# Patient Record
Sex: Male | Born: 1990 | Race: White | Hispanic: No | Marital: Married | Smoking: Current every day smoker
Health system: Southern US, Community
[De-identification: ages and names within clinical notes are randomized; demographics above are authoritative.]

## PROBLEM LIST (undated history)

## (undated) DIAGNOSIS — R454 Irritability and anger: Secondary | ICD-10-CM

## (undated) DIAGNOSIS — F419 Anxiety disorder, unspecified: Secondary | ICD-10-CM

## (undated) DIAGNOSIS — F6381 Intermittent explosive disorder: Secondary | ICD-10-CM

## (undated) DIAGNOSIS — K219 Gastro-esophageal reflux disease without esophagitis: Secondary | ICD-10-CM

---
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
## 2011-03-18 ENCOUNTER — Ambulatory Visit: Payer: Self-pay | Admitting: Unknown Physician Specialty

## 2011-03-31 ENCOUNTER — Ambulatory Visit: Payer: Self-pay | Admitting: Unknown Physician Specialty

## 2011-04-24 ENCOUNTER — Ambulatory Visit: Payer: Self-pay | Admitting: Unknown Physician Specialty

## 2011-05-01 ENCOUNTER — Ambulatory Visit: Payer: Self-pay | Admitting: Unknown Physician Specialty

## 2011-05-31 ENCOUNTER — Ambulatory Visit: Payer: Self-pay | Admitting: Unknown Physician Specialty

## 2011-07-01 ENCOUNTER — Ambulatory Visit: Payer: Self-pay | Admitting: Unknown Physician Specialty

## 2011-07-17 ENCOUNTER — Ambulatory Visit: Payer: Self-pay | Admitting: Unknown Physician Specialty

## 2011-07-29 LAB — RAPID URINE DRUG SCREEN, HOSP PERFORMED
Amphetamines, Ur Screen: NEGATIVE (ref ?–1000)
Barbiturates, Ur Screen: NEGATIVE (ref ?–200)
Benzodiazepine, Ur Scrn: NEGATIVE (ref ?–200)
Cannabinoid 50 Ng, Ur ~~LOC~~: POSITIVE (ref ?–50)
Opiate, Ur Screen: NEGATIVE (ref ?–300)

## 2012-06-18 ENCOUNTER — Emergency Department: Payer: Self-pay | Admitting: Emergency Medicine

## 2012-06-18 LAB — URINALYSIS, COMPLETE
Bacteria: NONE SEEN
Bilirubin,UR: NEGATIVE
Blood: NEGATIVE
Glucose,UR: NEGATIVE mg/dL (ref 0–75)
Leukocyte Esterase: NEGATIVE
Ph: 5 (ref 4.5–8.0)
Protein: 30
RBC,UR: 1 /HPF (ref 0–5)
Squamous Epithelial: NONE SEEN

## 2012-06-18 LAB — LIPASE, BLOOD: Lipase: 100 U/L (ref 73–393)

## 2012-06-18 LAB — COMPREHENSIVE METABOLIC PANEL
Alkaline Phosphatase: 84 U/L (ref 50–136)
Anion Gap: 9 (ref 7–16)
Bilirubin,Total: 0.4 mg/dL (ref 0.2–1.0)
Calcium, Total: 9.2 mg/dL (ref 8.5–10.1)
Chloride: 106 mmol/L (ref 98–107)
Co2: 25 mmol/L (ref 21–32)
Creatinine: 0.96 mg/dL (ref 0.60–1.30)
EGFR (African American): >60
EGFR (Non-African Amer.): >60
Potassium: 3.5 mmol/L (ref 3.5–5.1)
SGOT(AST): 26 U/L (ref 15–37)
SGPT (ALT): 22 U/L (ref 12–78)

## 2012-06-18 LAB — CBC
MCH: 32.7 pg (ref 26.0–34.0)
MCV: 96 fL (ref 80–100)
Platelet: 178 10*3/uL (ref 150–440)
RBC: 4.81 10*6/uL (ref 4.40–5.90)

## 2018-01-13 ENCOUNTER — Other Ambulatory Visit: Payer: Self-pay

## 2018-01-13 ENCOUNTER — Encounter: Payer: Self-pay | Admitting: Emergency Medicine

## 2018-01-13 ENCOUNTER — Emergency Department
Admission: EM | Admit: 2018-01-13 | Discharge: 2018-01-13 | Disposition: A | Discharge status: Home / Self Care | Payer: PRIVATE HEALTH INSURANCE | Attending: Emergency Medicine | Admitting: Emergency Medicine

## 2018-01-13 DIAGNOSIS — F6381 Intermittent explosive disorder: Secondary | ICD-10-CM

## 2018-01-13 DIAGNOSIS — F331 Major depressive disorder, recurrent, moderate: Secondary | ICD-10-CM

## 2018-01-13 DIAGNOSIS — F321 Major depressive disorder, single episode, moderate: Secondary | ICD-10-CM | POA: Diagnosis not present

## 2018-01-13 HISTORY — DX: Irritability and anger: R45.4

## 2018-01-13 HISTORY — DX: Gastro-esophageal reflux disease without esophagitis: K21.9

## 2018-01-13 HISTORY — DX: Intermittent explosive disorder: F63.81

## 2018-01-13 HISTORY — DX: Anxiety disorder, unspecified: F41.9

## 2018-01-13 LAB — URINALYSIS, COMPLETE (UACMP) WITH MICROSCOPIC
BACTERIA UA: NONE SEEN
Bilirubin Urine: NEGATIVE
Glucose, UA: NEGATIVE mg/dL
Hgb urine dipstick: NEGATIVE
KETONES UR: 20 mg/dL — AB
Leukocytes, UA: NEGATIVE
Nitrite: NEGATIVE
PH: 5 (ref 5.0–8.0)
Protein, ur: NEGATIVE mg/dL
SPECIFIC GRAVITY, URINE: 1.024 (ref 1.005–1.030)
SQUAMOUS EPITHELIAL / LPF: NONE SEEN (ref 0–5)

## 2018-01-13 LAB — COMPREHENSIVE METABOLIC PANEL
ALBUMIN: 4.9 g/dL (ref 3.5–5.0)
ALK PHOS: 47 U/L (ref 38–126)
ALT: 21 U/L (ref 0–44)
ANION GAP: 8 (ref 5–15)
AST: 30 U/L (ref 15–41)
BILIRUBIN TOTAL: 1.5 mg/dL — AB (ref 0.3–1.2)
BUN: 15 mg/dL (ref 6–20)
CALCIUM: 9.5 mg/dL (ref 8.9–10.3)
CO2: 25 mmol/L (ref 22–32)
CREATININE: 0.79 mg/dL (ref 0.61–1.24)
Chloride: 107 mmol/L (ref 98–111)
GFR calc Af Amer: >60 mL/min (ref >60)
GFR calc non Af Amer: >60 mL/min (ref >60)
GLUCOSE: 99 mg/dL (ref 70–99)
Potassium: 3.7 mmol/L (ref 3.5–5.1)
Sodium: 140 mmol/L (ref 135–145)
TOTAL PROTEIN: 7.6 g/dL (ref 6.5–8.1)

## 2018-01-13 LAB — URINE DRUG SCREEN, QUALITATIVE (ARMC ONLY)
Amphetamines, Ur Screen: NOT DETECTED
BENZODIAZEPINE, UR SCRN: NOT DETECTED
Cannabinoid 50 Ng, Ur ~~LOC~~: POSITIVE — AB
Cocaine Metabolite,Ur ~~LOC~~: NOT DETECTED
MDMA (Ecstasy)Ur Screen: NOT DETECTED
Methadone Scn, Ur: NOT DETECTED
OPIATE, UR SCREEN: NOT DETECTED
PHENCYCLIDINE (PCP) UR S: NOT DETECTED
Tricyclic, Ur Screen: NOT DETECTED

## 2018-01-13 LAB — CBC
HEMATOCRIT: 42.4 % (ref 40.0–52.0)
HEMOGLOBIN: 14.6 g/dL (ref 13.0–18.0)
MCH: 33.2 pg (ref 26.0–34.0)
MCHC: 34.5 g/dL (ref 32.0–36.0)
MCV: 96.3 fL (ref 80.0–100.0)
Platelets: 172 10*3/uL (ref 150–440)
RBC: 4.41 MIL/uL (ref 4.40–5.90)
RDW: 13.6 % (ref 11.5–14.5)
WBC: 7.6 10*3/uL (ref 3.8–10.6)

## 2018-01-13 LAB — ETHANOL: Alcohol, Ethyl (B): <10 mg/dL (ref <10)

## 2018-01-13 LAB — ACETAMINOPHEN LEVEL

## 2018-01-13 LAB — SALICYLATE LEVEL

## 2018-01-13 MED ORDER — SERTRALINE HCL 100 MG PO TABS: 100.0000 mg | ORAL_TABLET | Freq: Every day | ORAL | 0 refills | Status: DC

## 2018-01-13 MED ORDER — NICOTINE 14 MG/24HR TD PT24
14.0000 mg | MEDICATED_PATCH | Freq: Once | TRANSDERMAL | Status: DC
  Administered 2018-01-13: 14 mg via TRANSDERMAL
  Filled 2018-01-13: qty 1

## 2018-01-13 NOTE — ED Notes (Signed)
BEHAVIORAL HEALTH ROUNDING Patient sleeping: No. Patient alert and oriented: yes Behavior appropriate: Yes.  ; If no, describe:  Nutrition and fluids offered: yes Toileting and hygiene offered: Yes  Sitter present: q15 minute observations and security monitoring Law enforcement present: Yes    

## 2018-01-13 NOTE — ED Notes (Signed)
Hourly rounding reveals patient sleeping in room. No complaints, stable, in no acute distress. Q15 minute rounds and monitoring via Security to continue. 

## 2018-01-13 NOTE — Discharge Instructions (Addendum)
Please seek medical attention and help for any thoughts about wanting to harm yourself, harm others, any concerning change in behavior, severe depression, inappropriate drug use or any other new or concerning symptoms. ° °

## 2018-01-13 NOTE — ED Notes (Signed)
Labs drawn and sent to lab.  Pt dresed out in [place] scrubs by SYSCOCole RN.  Pt belongings include, boots, socks, jeans, underwear, black tank top, shirt, cigs, lighter, brown wallet, 4.00, cell phone with cracked screen, keys, 2 silver colored rings, 1 silver colored rope chain with silver colored charm.  Pt has 2 facial dermal piercings that can not be removed.  Belongings labelled and placed at Nurses station in [place].

## 2018-01-13 NOTE — ED Provider Notes (Signed)
[facility] Emergency Department Provider Note   ____________________________________________   First MD Initiated Contact with Patient 01/13/18 [phone]     (approximate)  I have reviewed the triage vital signs and the nursing notes.   HISTORY  Chief Complaint Psychiatric Evaluation    HPI Damon Romero is a 27 y.o. male who presents to the ED from home with a chief complaint of depression.  Patient has been seeing his PCP for depression and took Effexor in the spring.  Patient self discontinued this secondary to side effects.  Previously he also had side effects from Prozac.  He was recently started on Zoloft and feels like it is not working.  Denies active SI/HI/AH/VH.  Voices no medical complaints.  Past medical history None  There are no active problems to display for this patient.    Prior to Admission medications   Not on File    Allergies Patient has no known allergies.  No family history on file.  Social History Social History   Tobacco Use  . Smoking status: Not on file  Substance Use Topics  . Alcohol use: Not on file  . Drug use: Not on file  Denies recent drug use   Review of Systems  Constitutional: No fever/chills Eyes: No visual changes. ENT: No sore throat. Cardiovascular: Denies chest pain. Respiratory: Denies shortness of breath. Gastrointestinal: No abdominal pain.  No nausea, no vomiting.  No diarrhea.  No constipation. Genitourinary: Negative for dysuria. Musculoskeletal: Negative for back pain. Skin: Negative for rash. Neurological: Negative for headaches, focal weakness or numbness. Psychiatric:Positive for depression.   ____________________________________________   PHYSICAL EXAM:  VITAL SIGNS: ED Triage Vitals  Enc Vitals Group     BP 01/13/18 0558 140/83     Pulse Rate 01/13/18 0558 86     Resp 01/13/18 0558 20     Temp 01/13/18 0558 98.2 F (36.8 C)     Temp Source 01/13/18 0558 Oral     SpO2 01/13/18 0558 99 %     Weight 01/13/18 0559 180 lb (81.6 kg)     Height 01/13/18 0559 6\' 3"  (1.905 m)     Head Circumference --      Peak Flow --      Pain Score 01/13/18 0559 0     Pain Loc --      Pain Edu? --      Excl. in GC? --     Constitutional: Alert and oriented. Well appearing and in no acute distress. Eyes: Conjunctivae are normal. PERRL. EOMI. Head: Atraumatic. Nose: No congestion/rhinnorhea. Mouth/Throat: Mucous membranes are moist.  Oropharynx non-erythematous. Neck: No stridor.   Cardiovascular: Normal rate, regular rhythm. Grossly normal heart sounds.  Good peripheral circulation. Respiratory: Normal respiratory effort.  No retractions. Lungs CTAB. Gastrointestinal: Soft and nontender. No distention. No abdominal bruits. No CVA tenderness. Musculoskeletal: No lower extremity tenderness nor edema.  No joint effusions. Neurologic:  Normal speech and language. No gross focal neurologic deficits are appreciated. No gait instability. Skin:  Skin is warm, dry and intact. No rash noted. Psychiatric: Mood and affect are normal. Speech and behavior are normal.  ____________________________________________   LABS (all labs ordered are listed, but only abnormal results are displayed)  Labs Reviewed  CBC  COMPREHENSIVE METABOLIC PANEL  ETHANOL  URINE DRUG SCREEN, QUALITATIVE ([facility] ONLY)  URINALYSIS, COMPLETE ([facility]) WITH MICROSCOPIC   ____________________________________________  EKG  None ____________________________________________  RADIOLOGY  ED MD interpretation: None  Official radiology report(s): No results  found.  ____________________________________________   PROCEDURES  Procedure(s) performed: None  Procedures  Critical Care performed: No  ____________________________________________   INITIAL IMPRESSION / ASSESSMENT AND PLAN / ED COURSE  As part of my medical decision making, I reviewed the following data within the electronic  MEDICAL RECORD NUMBER Nursing notes reviewed and incorporated, Labs reviewed, A consult was requested and obtained from this/these consultant(s) Psychiatry and Notes from prior ED visits   27 year old male who presents with depression.  Contracts for safety while in the emergency department.  Will remain in the ED voluntarily pending TTS and psychiatry evaluations.  [facility] psychiatric disposition.      ____________________________________________   FINAL CLINICAL IMPRESSION(S) / ED DIAGNOSES  Final diagnoses:  Moderate episode of recurrent major depressive disorder [facility](HCC)     ED Discharge Orders    None       Note:  This document was prepared using Dragon voice recognition software and may include unintentional dictation errors.    Irean HongSung, Kamariyah Timberlake J, MD 01/13/18 [phone]

## 2018-01-13 NOTE — Consult Note (Signed)
[name] Psychiatry Consult   Reason for Consult: Consult for 27 year old man who came voluntarily to the emergency room requesting help with depression Referring Physician: Archie Balboa Patient Identification: Damon Romero MRN:  [id number] Principal Diagnosis: Moderate major depression, single episode [facility]) Diagnosis:   Patient Active Problem List   Diagnosis Date Noted  . Moderate major depression, single episode ([place]) [F32.1] 01/13/2018  . Intermittent explosive disorder [F63.81] 01/13/2018    Total Time spent with patient: 1.5 hours  Subjective:   Damon Romero is a 27 y.o. male patient admitted with "I get very angry".  HPI: Patient interviewed chart reviewed.  Patient reports that he has a long-standing problem with irritability and anger.  Minor things get him irritated and he will verbally lash out at people around him.  He will immediately feel guilty about it but stays irritable.  Last night was somewhat different.  He was feeling very sad and depressed last night.  He went to sleep much earlier than he usually does last night.  Another difference is that he had some alcohol last night which he does not usually do.  Patient denies having any wish to kill himself.  Denies any wish or intent to do any physical harm to anyone else.  Denies any psychotic symptoms.  He has seen his primary care doctor and been prescribed Zoloft 50 mg a day which he has been taking for about 3 weeks.  Patient says he abuses cannabis a little bit every now and then denies other drug use.  Social history: Patient lives with his long-term girlfriend and they have 3 children at home.  He works full-time.  He feels guilty about how his anger influences his wife and children.  Medical history: History of gastric reflux otherwise no significant medical problems  Substance abuse history: Says he generally does not drink last night was a bit of an exception.  Uses "a little" marijuana.  Past  Psychiatric History: Patient has never been in a psychiatric hospital no history of suicide attempts no history of hallucinations or major violence.  Risk to Self: Suicidal Ideation: No Suicidal Intent: No Is patient at risk for suicide?: No Suicidal Plan?: No Access to Means: No What has been your use of drugs/alcohol within the last 12 months?: THC  How many times?: 0 Other Self Harm Risks: None reported Triggers for Past Attempts: Other (Comment)(None reported) Intentional Self Injurious Behavior: None Risk to Others: Homicidal Ideation: No Thoughts of Harm to Others: No Current Homicidal Intent: No Current Homicidal Plan: No Access to Homicidal Means: No Identified Victim: None reported  History of harm to others?: No Assessment of Violence: In past 6-12 months Violent Behavior Description: patient stated is easily angered Does patient have access to weapons?: No Criminal Charges Pending?: No Does patient have a court date: No Prior Inpatient Therapy: Prior Inpatient Therapy: No Prior Outpatient Therapy: Prior Outpatient Therapy: No Does patient have an ACCT team?: No Does patient have Intensive In-House Services?  : No Does patient have [facility] services? : No Does patient have P4CC services?: No  Past Medical History:  Past Medical History:  Diagnosis Date  . Anxiety   . GERD (gastroesophageal reflux disease)   . Intermittent explosive disorder   . Irritability and anger     Family History: No family history on file. Family Psychiatric  History: Patient reports that his father had similar symptoms with angry and depressed all the time Social History:  Social History   Substance and  Sexual Activity  Alcohol Use Not Currently     Social History   Substance and Sexual Activity  Drug Use Yes  . Types: Marijuana    Social History   Socioeconomic History  . Marital status: Single    Spouse name: Not on file  . Number of children: Not on file  . Years of  education: Not on file  . Highest education level: Not on file  Occupational History  . Not on file  Social Needs  . Financial resource strain: Not on file  . Food insecurity:    Worry: Not on file    Inability: Not on file  . Transportation needs:    Medical: Not on file    Non-medical: Not on file  Tobacco Use  . Smoking status: Current Every Day Smoker  . Smokeless tobacco: Never Used  Substance and Sexual Activity  . Alcohol use: Not Currently  . Drug use: Yes    Types: Marijuana  . Sexual activity: Yes  Lifestyle  . Physical activity:    Days per week: Not on file    Minutes per session: Not on file  . Stress: Not on file  Relationships  . Social connections:    Talks on phone: Not on file    Gets together: Not on file    Attends religious service: Not on file    Active member of club or organization: Not on file    Attends meetings of clubs or organizations: Not on file    Relationship status: Not on file  Other Topics Concern  . Not on file  Social History Narrative  . Not on file   Additional Social History:    Allergies:  No Known Allergies  Labs:  Results for orders placed or performed during the hospital encounter of 01/13/18 (from the past 48 hour(s))  CBC     Status: None   Collection Time: 01/13/18  6:05 AM  Result Value Ref Range   WBC 7.6 3.8 - 10.6 K/uL   RBC 4.41 4.40 - 5.90 MIL/uL   Hemoglobin 14.6 13.0 - 18.0 g/dL   HCT 42.4 40.0 - 52.0 %   MCV 96.3 80.0 - 100.0 fL   MCH 33.2 26.0 - 34.0 pg   MCHC 34.5 32.0 - 36.0 g/dL   RDW 13.6 11.5 - 14.5 %   Platelets 172 150 - 440 K/uL    Comment: Performed at [facility], [address] [place], [place] [address]  Comprehensive metabolic panel     Status: Abnormal   Collection Time: 01/13/18  6:05 AM  Result Value Ref Range   Sodium 140 135 - 145 mmol/L   Potassium 3.7 3.5 - 5.1 mmol/L   Chloride 107 98 - 111 mmol/L    Comment: Please note change in reference range.   CO2 25 22 -  32 mmol/L   Glucose, Bld 99 70 - 99 mg/dL    Comment: Please note change in reference range.   BUN 15 6 - 20 mg/dL    Comment: Please note change in reference range.   Creatinine, Ser 0.79 0.61 - 1.24 mg/dL   Calcium 9.5 8.9 - 10.3 mg/dL   Total Protein 7.6 6.5 - 8.1 g/dL   Albumin 4.9 3.5 - 5.0 g/dL   AST 30 15 - 41 U/L   ALT 21 0 - 44 U/L    Comment: Please note change in reference range.   Alkaline Phosphatase 47 38 - 126 U/L  Total Bilirubin 1.5 (H) 0.3 - 1.2 mg/dL   GFR calc non Af Amer >60 >60 mL/min   GFR calc Af Amer >60 >60 mL/min    Comment: (NOTE) The eGFR has been calculated using the CKD EPI equation. This calculation has not been validated in all clinical situations. eGFR's persistently <60 mL/min signify possible Chronic Kidney Disease.    Anion gap 8 5 - 15    Comment: Performed at [facility], [address] [place], [place] [address]  Ethanol     Status: None   Collection Time: 01/13/18  6:05 AM  Result Value Ref Range   Alcohol, Ethyl (B) <10 <10 mg/dL    Comment: (NOTE) Lowest detectable limit for serum alcohol is 10 mg/dL. For medical purposes only. Performed at [facility], [address] [place], [place] [address]   Urine Drug Screen, Qualitative [facility] only)     Status: Abnormal   Collection Time: 01/13/18  6:05 AM  Result Value Ref Range   Tricyclic, Ur Screen NONE DETECTED NONE DETECTED   Amphetamines, Ur Screen NONE DETECTED NONE DETECTED   MDMA (Ecstasy)Ur Screen NONE DETECTED NONE DETECTED   Cocaine Metabolite,Ur [place] NONE DETECTED NONE DETECTED   Opiate, Ur Screen NONE DETECTED NONE DETECTED   Phencyclidine (PCP) Ur S NONE DETECTED NONE DETECTED   Cannabinoid 50 Ng, Ur [place] POSITIVE (A) NONE DETECTED   Barbiturates, Ur Screen (A) NONE DETECTED    Result not available. Reagent lot number recalled by manufacturer.   Benzodiazepine, Ur Scrn NONE DETECTED NONE DETECTED   Methadone Scn, Ur NONE DETECTED NONE DETECTED     Comment: (NOTE) Tricyclics + metabolites, urine    Cutoff 1000 ng/mL Amphetamines + metabolites, urine  Cutoff 1000 ng/mL MDMA (Ecstasy), urine              Cutoff 500 ng/mL Cocaine Metabolite, urine          Cutoff 300 ng/mL Opiate + metabolites, urine        Cutoff 300 ng/mL Phencyclidine (PCP), urine         Cutoff 25 ng/mL Cannabinoid, urine                 Cutoff 50 ng/mL Barbiturates + metabolites, urine  Cutoff 200 ng/mL Benzodiazepine, urine              Cutoff 200 ng/mL Methadone, urine                   Cutoff 300 ng/mL The urine drug screen provides only a preliminary, unconfirmed analytical test result and should not be used for non-medical purposes. Clinical consideration and professional judgment should be applied to any positive drug screen result due to possible interfering substances. A more specific alternate chemical method must be used in order to obtain a confirmed analytical result. Gas chromatography / mass spectrometry (GC/MS) is the preferred confirmat ory method. Performed at [facility], [address] [place],  [address]   Urinalysis, Complete w Microscopic     Status: Abnormal   Collection Time: 01/13/18  6:05 AM  Result Value Ref Range   Color, Urine AMBER (A) YELLOW    Comment: BIOCHEMICALS MAY BE AFFECTED BY COLOR   APPearance CLEAR (A) CLEAR   Specific Gravity, Urine 1.024 1.005 - 1.030   pH 5.0 5.0 - 8.0   Glucose, UA NEGATIVE NEGATIVE mg/dL   Hgb urine dipstick NEGATIVE NEGATIVE   Bilirubin Urine NEGATIVE NEGATIVE   Ketones, ur  20 (A) NEGATIVE mg/dL   Protein, ur NEGATIVE NEGATIVE mg/dL   Nitrite NEGATIVE NEGATIVE   Leukocytes, UA NEGATIVE NEGATIVE   RBC / HPF 0-5 0 - 5 RBC/hpf   WBC, UA 0-5 0 - 5 WBC/hpf   Bacteria, UA NONE SEEN NONE SEEN   Squamous Epithelial / LPF NONE SEEN 0 - 5   Mucus PRESENT     Comment: Performed at [facility], [address], [place], [place] [address]  Acetaminophen level      Status: Abnormal   Collection Time: 01/13/18  6:05 AM  Result Value Ref Range   Acetaminophen (Tylenol), Serum <10 (L) 10 - 30 ug/mL    Comment: (NOTE) Therapeutic concentrations vary significantly. A range of 10-30 ug/mL  may be an effective concentration for many patients. However, some  are best treated at concentrations outside of this range. Acetaminophen concentrations >150 ug/mL at 4 hours after ingestion  and >50 ug/mL at 12 hours after ingestion are often associated with  toxic reactions. Performed at [facility], [address] [place], [place] [address]   Salicylate level     Status: None   Collection Time: 01/13/18  6:05 AM  Result Value Ref Range   Salicylate Lvl <7.8 2.8 - 30.0 mg/dL    Comment: Performed at [facility], [address] [place], [place] [address]    Current Facility-Administered Medications  Medication Dose Route Frequency Provider Last Rate Last Dose  . nicotine (NICODERM CQ - dosed in mg/24 hours) patch 14 mg  14 mg Transdermal Once Delman Kitten, MD   14 mg at 01/13/18 1356   Current Outpatient Medications  Medication Sig Dispense Refill  . sertraline (ZOLOFT) 100 MG tablet Take 1 tablet (100 mg total) by mouth daily. 30 tablet 0    Musculoskeletal: Strength & Muscle Tone: within normal limits Gait & Station: normal Patient leans: N/A  Psychiatric Specialty Exam: Physical Exam  Nursing note and vitals reviewed. Constitutional: He appears well-developed and well-nourished.  HENT:  Head: Normocephalic and atraumatic.  Eyes: Pupils are equal, round, and reactive to light. Conjunctivae are normal.  Neck: Normal range of motion.  Cardiovascular: Regular rhythm and normal heart sounds.  Respiratory: Effort normal. No respiratory distress.  GI: Soft.  Musculoskeletal: Normal range of motion.  Neurological: He is alert.  Skin: Skin is warm and dry.  Psychiatric: He has a normal mood and affect. His behavior is normal.  Judgment and thought content normal.    Review of Systems  Constitutional: Negative.   HENT: Negative.   Eyes: Negative.   Respiratory: Negative.   Cardiovascular: Negative.   Gastrointestinal: Negative.   Musculoskeletal: Negative.   Skin: Negative.   Neurological: Negative.   Psychiatric/Behavioral: Positive for depression. Negative for hallucinations, memory loss, substance abuse and suicidal ideas. The patient is nervous/anxious and has insomnia.     Blood pressure 140/83, pulse 86, temperature 98.2 F (36.8 C), temperature source Oral, resp. rate 20, height 6' 3"  (1.905 m), weight 81.6 kg (180 lb), SpO2 99 %.Body mass index is 22.5 kg/m.  General Appearance: Fairly Groomed  Eye Contact:  Good  Speech:  Clear and Coherent  Volume:  Normal  Mood:  Depressed  Affect:  Congruent  Thought Process:  Goal Directed  Orientation:  Full (Time, Place, and Person)  Thought Content:  Logical  Suicidal Thoughts:  No  Homicidal Thoughts:  No  Memory:  Immediate;   Good Recent;   Fair Remote;   Fair  Judgement:  Fair  Insight:  Fair  Psychomotor Activity:  Normal  Concentration:  Concentration: Fair  Recall:  Poor  Fund of Knowledge:  Fair  Language:  Fair  Akathisia:  No  Handed:  Right  AIMS (if indicated):     Assets:  Desire for Improvement Housing Physical Health Resilience Social Support  ADL's:  Intact  Cognition:  WNL  Sleep:        Treatment Plan Summary: Medication management and Plan 27 year old man who has multiple symptoms of a moderate degree of major depression.  I recommend to him that we increase his Zoloft to 100 mg a day.  Prescription written.  Patient advised that if he is not feeling significantly better that would be a reason to change his medicine and in any case he needs to follow-up with a mental health provider.  He will be given information about referrals to mental health treatment in the community.  Psychoeducation completed.  I also encouraged  him to stop using the bulking supplement that he has been taking recently as I am concerned that might add to irritability.  Patient does not meet commitment criteria does not need inpatient treatment.  Case reviewed with emergency room doctor.  Disposition: No evidence of imminent risk to self or others at present.   Patient does not meet criteria for psychiatric inpatient admission. Supportive therapy provided about ongoing stressors. Discussed crisis plan, support from social network, calling 911, coming to the Emergency Department, and calling Suicide Hotline.  Alethia Berthold, MD 01/13/2018 4:37 PM

## 2018-01-13 NOTE — ED Notes (Signed)
Patient discharged home, patient received discharge papers and prescription for Zoloft. Patient received belongings and verbalized he has received all of his belongings. Patient appropriate and cooperative, Denies SI/HI AVH. Vital signs taken. NAD noted.

## 2018-01-13 NOTE — ED Provider Notes (Signed)
Patient seen by Dr. Toni Amendlapacs. Feels patient is safe for discharge.    Damon Romero, [name], MD 01/13/18 [phone]

## 2018-01-13 NOTE — ED Triage Notes (Signed)
Pt in states he wants to see psychiatrist. [facility] that has been switching his psych meds. States he takes meds for aggression but does not feel improvement. Pt tearful in triage states unsure when asked about suicidal ideation.

## 2018-01-13 NOTE — ED Notes (Signed)
Patient eating lunch.
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
## 2018-01-13 NOTE — ED Notes (Signed)
Pt belongings given to pt per RN approval

## 2020-02-26 ENCOUNTER — Emergency Department: Payer: BC Managed Care – PPO

## 2020-02-26 ENCOUNTER — Emergency Department
Admission: EM | Admit: 2020-02-26 | Discharge: 2020-02-26 | Disposition: A | Discharge status: Home / Self Care | Payer: BC Managed Care – PPO | Attending: Emergency Medicine | Admitting: Emergency Medicine

## 2020-02-26 ENCOUNTER — Other Ambulatory Visit: Payer: Self-pay

## 2020-02-26 DIAGNOSIS — S81811A Laceration without foreign body, right lower leg, initial encounter: Secondary | ICD-10-CM | POA: Insufficient documentation

## 2020-02-26 DIAGNOSIS — Y999 Unspecified external cause status: Secondary | ICD-10-CM | POA: Insufficient documentation

## 2020-02-26 DIAGNOSIS — T1490XA Injury, unspecified, initial encounter: Secondary | ICD-10-CM

## 2020-02-26 DIAGNOSIS — Y929 Unspecified place or not applicable: Secondary | ICD-10-CM | POA: Insufficient documentation

## 2020-02-26 DIAGNOSIS — Y939 Activity, unspecified: Secondary | ICD-10-CM | POA: Diagnosis not present

## 2020-02-26 DIAGNOSIS — F17200 Nicotine dependence, unspecified, uncomplicated: Secondary | ICD-10-CM | POA: Insufficient documentation

## 2020-02-26 DIAGNOSIS — Z23 Encounter for immunization: Secondary | ICD-10-CM | POA: Insufficient documentation

## 2020-02-26 LAB — CBC WITH DIFFERENTIAL/PLATELET
Abs Immature Granulocytes: 0.07 10*3/uL (ref 0.00–0.07)
Basophils Absolute: 0.1 10*3/uL (ref 0.0–0.1)
Basophils Relative: 1 %
Eosinophils Absolute: 0.2 10*3/uL (ref 0.0–0.5)
Eosinophils Relative: 2 %
HCT: 42.7 % (ref 39.0–52.0)
Hemoglobin: 15 g/dL (ref 13.0–17.0)
Immature Granulocytes: 1 %
Lymphocytes Relative: 33 %
Lymphs Abs: 3.5 10*3/uL (ref 0.7–4.0)
MCH: 33 pg (ref 26.0–34.0)
MCHC: 35.1 g/dL (ref 30.0–36.0)
MCV: 94.1 fL (ref 80.0–100.0)
Monocytes Absolute: 1.2 10*3/uL — ABNORMAL HIGH (ref 0.1–1.0)
Monocytes Relative: 11 %
Neutro Abs: 5.7 10*3/uL (ref 1.7–7.7)
Neutrophils Relative %: 52 %
Platelets: 202 10*3/uL (ref 150–400)
RBC: 4.54 MIL/uL (ref 4.22–5.81)
RDW: 13.9 % (ref 11.5–15.5)
WBC: 10.8 10*3/uL — ABNORMAL HIGH (ref 4.0–10.5)
nRBC: 0 % (ref 0.0–0.2)

## 2020-02-26 LAB — BASIC METABOLIC PANEL
Anion gap: 17 — ABNORMAL HIGH (ref 5–15)
BUN: 17 mg/dL (ref 6–20)
CO2: 18 mmol/L — ABNORMAL LOW (ref 22–32)
Calcium: 9.3 mg/dL (ref 8.9–10.3)
Chloride: 102 mmol/L (ref 98–111)
Creatinine, Ser: 1.06 mg/dL (ref 0.61–1.24)
GFR calc Af Amer: >60 mL/min (ref >60)
GFR calc non Af Amer: >60 mL/min (ref >60)
Glucose, Bld: 111 mg/dL — ABNORMAL HIGH (ref 70–99)
Potassium: 3.3 mmol/L — ABNORMAL LOW (ref 3.5–5.1)
Sodium: 137 mmol/L (ref 135–145)

## 2020-02-26 LAB — TYPE AND SCREEN
ABO/RH(D): O NEG
Antibody Screen: NEGATIVE

## 2020-02-26 LAB — PROTIME-INR
INR: 0.9 (ref 0.8–1.2)
Prothrombin Time: 11.9 seconds (ref 11.4–15.2)

## 2020-02-26 MED ORDER — HYDROCODONE-ACETAMINOPHEN 5-325 MG PO TABS: 1.0000 | ORAL_TABLET | ORAL | 0 refills | Status: AC | PRN

## 2020-02-26 MED ORDER — AMOXICILLIN-POT CLAVULANATE 875-125 MG PO TABS
1.0000 | ORAL_TABLET | Freq: Once | ORAL | Status: AC
  Administered 2020-02-26: 1 via ORAL
  Filled 2020-02-26: qty 1

## 2020-02-26 MED ORDER — TETANUS-DIPHTH-ACELL PERTUSSIS 5-2.5-18.5 LF-MCG/0.5 IM SUSP
0.5000 mL | Freq: Once | INTRAMUSCULAR | Status: AC
  Administered 2020-02-26: 0.5 mL via INTRAMUSCULAR
  Filled 2020-02-26: qty 0.5

## 2020-02-26 MED ORDER — AMOXICILLIN-POT CLAVULANATE 875-125 MG PO TABS: 1.0000 | ORAL_TABLET | Freq: Two times a day (BID) | ORAL | 0 refills | Status: AC

## 2020-02-26 MED ORDER — NICOTINE 21 MG/24HR TD PT24
21.0000 mg | MEDICATED_PATCH | Freq: Once | TRANSDERMAL | Status: DC
  Administered 2020-02-26: 21 mg via TRANSDERMAL
  Filled 2020-02-26: qty 1

## 2020-02-26 MED ORDER — HYDROMORPHONE HCL 1 MG/ML IJ SOLN
1.0000 mg | Freq: Once | INTRAMUSCULAR | Status: AC
  Administered 2020-02-26: 1 mg via INTRAVENOUS
  Filled 2020-02-26: qty 1

## 2020-02-26 MED ORDER — LIDOCAINE-EPINEPHRINE 1 %-1:100000 IJ SOLN
20.0000 mL | Freq: Once | INTRAMUSCULAR | Status: AC
  Administered 2020-02-26: 20 mL via INTRADERMAL
  Filled 2020-02-26: qty 20
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
## 2020-02-26 NOTE — ED Notes (Signed)
Family at bedside. 

## 2020-02-26 NOTE — ED Notes (Signed)
MD at bedside suturing laceration.

## 2020-02-26 NOTE — Discharge Instructions (Signed)
Take the antibiotic as prescribed and finish the full course. You may take the pain medication as needed although you should transition to over-the-counter Tylenol or ibuprofen within the next 1 to 2 days. Use the crutches as needed.  You may put weight on the leg as tolerated. Keep the leg elevated and use ice to help with swelling and pain. Return to the ER immediately for new, worsening, or persistent severe pain, worsening swelling, pain going up the leg, pus drainage, bleeding, redness, fever or chills, weakness or numbness, or any other new or worsening symptoms that concern you.

## 2020-02-26 NOTE — ED Notes (Signed)
Pt has multiple abrasions on his right and left legs, left arm, and left lower back. Pt has deep laceration on the right calf, bleeding is currently controlled, and laceration is wrapped in gauze that was applied in triage. Pt has bruising on knot on the right medial leg, just beside his patella. No bruising or tenderness noted on this abdomen.
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
## 2020-03-07 ENCOUNTER — Other Ambulatory Visit: Payer: Self-pay

## 2020-03-07 ENCOUNTER — Emergency Department: Payer: BC Managed Care – PPO

## 2020-03-07 ENCOUNTER — Emergency Department
Admission: EM | Admit: 2020-03-07 | Discharge: 2020-03-07 | Disposition: A | Discharge status: Home / Self Care | Payer: BC Managed Care – PPO | Attending: Emergency Medicine | Admitting: Emergency Medicine

## 2020-03-07 ENCOUNTER — Encounter: Payer: Self-pay | Admitting: Emergency Medicine

## 2020-03-07 DIAGNOSIS — L03115 Cellulitis of right lower limb: Secondary | ICD-10-CM | POA: Diagnosis present

## 2020-03-07 DIAGNOSIS — F17200 Nicotine dependence, unspecified, uncomplicated: Secondary | ICD-10-CM | POA: Diagnosis not present

## 2020-03-07 DIAGNOSIS — Z4802 Encounter for removal of sutures: Secondary | ICD-10-CM | POA: Insufficient documentation

## 2020-03-07 MED ORDER — TRAMADOL HCL 50 MG PO TABS: 50.0000 mg | ORAL_TABLET | Freq: Four times a day (QID) | ORAL | 0 refills | Status: AC | PRN

## 2020-03-07 MED ORDER — SULFAMETHOXAZOLE-TRIMETHOPRIM 800-160 MG PO TABS: 1.0000 | ORAL_TABLET | Freq: Two times a day (BID) | ORAL | 0 refills | Status: AC

## 2020-03-07 NOTE — Discharge Instructions (Signed)
Follow-up with orthopedics if continued ankle pain in 1 week.  Follow-up with your regular doctor for evaluation of the cellulitis.  Return emergency department worsening

## 2020-03-07 NOTE — ED Triage Notes (Signed)
Presents for suture and staple removal to right lower leg   Area around staple and sutures are red  Ankle is swollen and tender

## 2020-03-07 NOTE — ED Provider Notes (Signed)
[facility] Emergency Department Provider Note  ____________________________________________   First MD Initiated Contact with Patient 03/07/20 1003     (approximate)  I have reviewed the triage vital signs and the nursing notes.   HISTORY  Chief Complaint Suture / Staple Removal and Wound Check    HPI Damon Romero is a 29 y.o. male presents emergency department with complaints of redness around the sutures and right ankle being painful.  Patient was seen here approximately 10 days ago had a negative tib-fib x-ray and had sutures/staples applied.  He denies fever or chills.  No drainage from the wound.    Past Medical History:  Diagnosis Date  . Anxiety   . GERD (gastroesophageal reflux disease)   . Intermittent explosive disorder   . Irritability and anger     Patient Active Problem List   Diagnosis Date Noted  . Moderate major depression, single episode (HCC) 01/13/2018  . Intermittent explosive disorder 01/13/2018    History reviewed. No pertinent surgical history.  Prior to Admission medications   Medication Sig Start Date End Date Taking? Authorizing Provider  omeprazole (PRILOSEC) 20 MG capsule Take 20 mg by mouth daily.   Yes [provider]  PARoxetine (PAXIL) 20 MG tablet Take 20 mg by mouth daily.   Yes [provider]  sulfamethoxazole-trimethoprim (BACTRIM DS) 800-160 MG tablet Take 1 tablet by mouth 2 (two) times daily. 03/07/20   Faythe Ghee, PA-C    Allergies Patient has no known allergies.  No family history on file.  Social History Social History   Tobacco Use  . Smoking status: Current Every Day Smoker  . Smokeless tobacco: Never Used  Substance Use Topics  . Alcohol use: Yes    Comment: everyday couple beers  . Drug use: Yes    Types: Marijuana    Review of Systems  Constitutional: No fever/chills Eyes: No visual changes. ENT: No sore throat. Respiratory: Denies  cough Cardiovascular: Denies chest pain Gastrointestinal: Denies abdominal pain Genitourinary: Negative for dysuria. Musculoskeletal: Negative for back pain.  Positive for right ankle pain Skin: Negative for rash.  Positive for redness at the suture lines Psychiatric: no mood changes,     ____________________________________________   PHYSICAL EXAM:  VITAL SIGNS: ED Triage Vitals  Enc Vitals Group     BP 03/07/20 0957 127/78     Pulse Rate 03/07/20 0957 80     Resp 03/07/20 0957 18     Temp 03/07/20 0957 98 F (36.7 C)     Temp Source 03/07/20 0957 Oral     SpO2 03/07/20 0957 98 %     Weight 03/07/20 0953 198 lb 6.6 oz (90 kg)     Height 03/07/20 0953 6\' 2"  (1.88 m)     Head Circumference --      Peak Flow --      Pain Score 03/07/20 0953 4     Pain Loc --      Pain Edu? --      Excl. in [place]? --     Constitutional: Alert and oriented. Well appearing and in no acute distress. Eyes: Conjunctivae are normal.  Head: Atraumatic. Nose: No congestion/rhinnorhea. Mouth/Throat: Mucous membranes are moist.   Neck:  supple no lymphadenopathy noted Cardiovascular: Normal rate, regular rhythm.  Respiratory: Normal respiratory effort.  No retractions, l GU: deferred Musculoskeletal: FROM all extremities, warm and well perfused, right ankle has some redness stemming from the abrasion that is healing on the area above  the ankle, suture reaction is noted at the sutures and staples, no pus or drainage noted, right ankle is tender to palpation Neurologic:  Normal speech and language.  Skin:  Skin is warm, dry and intact. No rash noted. Psychiatric: Mood and affect are normal. Speech and behavior are normal.  ____________________________________________   LABS (all labs ordered are listed, but only abnormal results are displayed)  Labs Reviewed - No data to display ____________________________________________   ____________________________________________  RADIOLOGY  X-ray of  the right ankle is negative  ____________________________________________   PROCEDURES  Procedure(s) performed:   .Suture Removal  Date/Time: 03/07/2020 11:06 AM Performed by: Faythe Ghee, PA-C Authorized by: Faythe Ghee, PA-C   Consent:    Consent obtained:  Verbal   Consent given by:  Patient   Risks discussed:  Bleeding, pain and wound separation Location:    Location:  Lower extremity   Lower extremity location:  Leg Procedure details:    Wound appearance:  Good wound healing and pink   Number of sutures removed:  16   Number of staples removed:  8 Post-procedure details:    Patient tolerance of procedure:  Tolerated well, no immediate complications      ____________________________________________   INITIAL IMPRESSION / ASSESSMENT AND PLAN / ED COURSE  Pertinent labs & imaging results that were available during my care of the patient were reviewed by me and considered in my medical decision making (see chart for details).   Patient is a 29 year old male presents emergency department with concerns of infection around his sutures or staples that need to be removed today.  See HPI.  Physical exam does show some redness around the sutures and staples which most likely are suture reaction.  See procedure note for suture/staple removal.  Area did not separator dehisce.  X-ray of the right ankle was negative.  Did explain the findings to the patient.  Due to the amount of redness that is below the suture area will place him on Septra 1 twice daily for 7 days.  Follow-up with his regular doctor in 3 days for recheck.  Return if worsening.  Is discharged stable condition.     Damon Romero was evaluated in Emergency Department on 03/07/2020 for the symptoms described in the history of present illness. He was evaluated in the context of the global COVID-19 pandemic, which necessitated consideration that the patient might be at risk for infection with the SARS-CoV-2  virus that causes COVID-19. Institutional protocols and algorithms that pertain to the evaluation of patients at risk for COVID-19 are in a state of rapid change based on information released by regulatory bodies including the CDC and federal and state organizations. These policies and algorithms were followed during the patient's care in the ED.    As part of my medical decision making, I reviewed the following data within the electronic MEDICAL RECORD NUMBER Nursing notes reviewed and incorporated, Old chart reviewed, Radiograph reviewed , Notes from prior ED visits and [place] Controlled Substance Database  ____________________________________________   FINAL CLINICAL IMPRESSION(S) / ED DIAGNOSES  Final diagnoses:  Visit for suture removal  Cellulitis of right lower extremity      NEW MEDICATIONS STARTED DURING THIS VISIT:  New Prescriptions   SULFAMETHOXAZOLE-TRIMETHOPRIM (BACTRIM DS) 800-160 MG TABLET    Take 1 tablet by mouth 2 (two) times daily.     Note:  This document was prepared using Dragon voice recognition software and may include unintentional dictation errors.  Faythe Ghee, PA-C 03/07/20 1113    Sharman Cheek, MD 03/07/20 1416

## 2021-01-28 ENCOUNTER — Other Ambulatory Visit: Payer: Self-pay

## 2021-01-28 DIAGNOSIS — R1011 Right upper quadrant pain: Secondary | ICD-10-CM | POA: Insufficient documentation

## 2021-01-28 DIAGNOSIS — F17200 Nicotine dependence, unspecified, uncomplicated: Secondary | ICD-10-CM | POA: Insufficient documentation

## 2021-01-28 DIAGNOSIS — R112 Nausea with vomiting, unspecified: Secondary | ICD-10-CM | POA: Diagnosis not present

## 2021-01-28 DIAGNOSIS — R1013 Epigastric pain: Secondary | ICD-10-CM | POA: Diagnosis not present

## 2021-01-28 DIAGNOSIS — K219 Gastro-esophageal reflux disease without esophagitis: Secondary | ICD-10-CM | POA: Insufficient documentation

## 2021-01-28 DIAGNOSIS — R109 Unspecified abdominal pain: Secondary | ICD-10-CM | POA: Diagnosis present

## 2021-01-28 LAB — CBC
HCT: 42.8 % (ref 39.0–52.0)
Hemoglobin: 15.1 g/dL (ref 13.0–17.0)
MCH: 33 pg (ref 26.0–34.0)
MCHC: 35.3 g/dL (ref 30.0–36.0)
MCV: 93.7 fL (ref 80.0–100.0)
Platelets: 193 10*3/uL (ref 150–400)
RBC: 4.57 MIL/uL (ref 4.22–5.81)
RDW: 13.4 % (ref 11.5–15.5)
WBC: 14.1 10*3/uL — ABNORMAL HIGH (ref 4.0–10.5)
nRBC: 0 % (ref 0.0–0.2)

## 2021-01-28 LAB — COMPREHENSIVE METABOLIC PANEL
ALT: 81 U/L — ABNORMAL HIGH (ref 0–44)
AST: 239 U/L — ABNORMAL HIGH (ref 15–41)
Albumin: 4.8 g/dL (ref 3.5–5.0)
Alkaline Phosphatase: 62 U/L (ref 38–126)
Anion gap: 8 (ref 5–15)
BUN: 18 mg/dL (ref 6–20)
CO2: 24 mmol/L (ref 22–32)
Calcium: 9.1 mg/dL (ref 8.9–10.3)
Chloride: 106 mmol/L (ref 98–111)
Creatinine, Ser: 0.91 mg/dL (ref 0.61–1.24)
GFR, Estimated: >60 mL/min (ref >60)
Glucose, Bld: 130 mg/dL — ABNORMAL HIGH (ref 70–99)
Potassium: 3.7 mmol/L (ref 3.5–5.1)
Sodium: 138 mmol/L (ref 135–145)
Total Bilirubin: 0.9 mg/dL (ref 0.3–1.2)
Total Protein: 7.6 g/dL (ref 6.5–8.1)

## 2021-01-28 LAB — LIPASE, BLOOD: Lipase: 43 U/L (ref 11–51)

## 2021-01-28 MED ORDER — ONDANSETRON 4 MG PO TBDP
4.0000 mg | ORAL_TABLET | Freq: Once | ORAL | Status: DC | PRN
  Filled 2021-01-28: qty 1

## 2021-01-28 NOTE — ED Triage Notes (Signed)
Pt presents to ER c/o right upper quadrant abd pain that started around 3 hrs ago.  Pt states he has had associated n/v/d as well.  Pt states he is a frequent drinker and had one drink today.  Pt denies any pertinent pmh.  Pt A&Ox4 at this time.

## 2021-01-29 ENCOUNTER — Emergency Department
Admission: EM | Admit: 2021-01-29 | Discharge: 2021-01-29 | Disposition: A | Discharge status: Home / Self Care | Payer: BC Managed Care – PPO | Attending: Emergency Medicine | Admitting: Emergency Medicine

## 2021-01-29 ENCOUNTER — Emergency Department: Payer: BC Managed Care – PPO

## 2021-01-29 DIAGNOSIS — R1011 Right upper quadrant pain: Secondary | ICD-10-CM

## 2021-01-29 DIAGNOSIS — R7989 Other specified abnormal findings of blood chemistry: Secondary | ICD-10-CM

## 2021-01-29 DIAGNOSIS — R1013 Epigastric pain: Secondary | ICD-10-CM

## 2021-01-29 LAB — URINALYSIS, COMPLETE (UACMP) WITH MICROSCOPIC
Bacteria, UA: NONE SEEN
Bilirubin Urine: NEGATIVE
Glucose, UA: NEGATIVE mg/dL
Hgb urine dipstick: NEGATIVE
Ketones, ur: NEGATIVE mg/dL
Leukocytes,Ua: NEGATIVE
Nitrite: NEGATIVE
Protein, ur: NEGATIVE mg/dL
Specific Gravity, Urine: 1.011 (ref 1.005–1.030)
Squamous Epithelial / HPF: NONE SEEN (ref 0–5)
pH: 7 (ref 5.0–8.0)

## 2021-01-29 MED ORDER — FAMOTIDINE IN NACL 20-0.9 MG/50ML-% IV SOLN
20.0000 mg | Freq: Once | INTRAVENOUS | Status: AC
  Administered 2021-01-29: 20 mg via INTRAVENOUS
  Filled 2021-01-29: qty 50

## 2021-01-29 MED ORDER — LACTATED RINGERS IV BOLUS
1000.0000 mL | Freq: Once | INTRAVENOUS | Status: AC
  Administered 2021-01-29: 1000 mL via INTRAVENOUS

## 2021-01-29 MED ORDER — ONDANSETRON HCL 4 MG/2ML IJ SOLN
4.0000 mg | Freq: Once | INTRAMUSCULAR | Status: AC
  Administered 2021-01-29: 4 mg via INTRAVENOUS
  Filled 2021-01-29: qty 2

## 2021-01-29 NOTE — ED Provider Notes (Signed)
[facility]  ____________________________________________   Event Date/Time   First MD Initiated Contact with Patient 01/29/21 [phone]     (approximate)  I have reviewed the triage vital signs and the nursing notes.   HISTORY  Chief Complaint Abdominal Pain    HPI Damon Romero is a 30 y.o. male alcohol use disorder, chronic nausea and diarrhea who presents with nausea vomiting abdominal pain.  Symptoms started last night.  He endorses pain in the epigastric region that is constant and worse after vomiting.  Has had multiple episodes of emesis since being in the ED but did briefly tolerate p.o.  Denies hematemesis.  He has chronic diarrhea but no melena.  Had chills but no fevers.  Denies change in stool color.  No sick contacts.  He drinks heavily, multiple pints of wine per day last drink was at 5 PM yesterday.  Denies any history of withdrawal symptoms.  Has seen GI and had endoscopy in the past.         Past Medical History:  Diagnosis Date   Anxiety    GERD (gastroesophageal reflux disease)    Intermittent explosive disorder    Irritability and anger     Patient Active Problem List   Diagnosis Date Noted   Moderate major depression, single episode ([place]) 01/13/2018   Intermittent explosive disorder 01/13/2018    History reviewed. No pertinent surgical history.  Prior to Admission medications   Medication Sig Start Date End Date Taking? Authorizing Provider  omeprazole (PRILOSEC) 20 MG capsule Take 20 mg by mouth daily.    [provider]  PARoxetine (PAXIL) 20 MG tablet Take 20 mg by mouth daily.    [provider]  sulfamethoxazole-trimethoprim (BACTRIM DS) 800-160 MG tablet Take 1 tablet by mouth 2 (two) times daily. 03/07/20   Fisher, Linden Dolin, PA-C  traMADol (ULTRAM) 50 MG tablet Take 1 tablet (50 mg total) by mouth every 6 (six) hours as needed. 03/07/20   Versie Starks, PA-C    Allergies Patient has no known  allergies.  History reviewed. No pertinent family history.  Social History Social History   Tobacco Use   Smoking status: Every Day   Smokeless tobacco: Never  Substance Use Topics   Alcohol use: Yes    Comment: everyday couple bootleggers   Drug use: Yes    Types: Marijuana    Review of Systems   Review of Systems  Constitutional:  Positive for appetite change, chills and fever.  Respiratory:  Negative for chest tightness and shortness of breath.   Gastrointestinal:  Positive for abdominal pain, anal bleeding, diarrhea, nausea and vomiting. Negative for constipation.  Genitourinary:  Negative for difficulty urinating.  Skin:  Negative for rash.  All other systems reviewed and are negative.  Physical Exam Updated Vital Signs BP (!) 156/94   Pulse 68   Temp 98.7 F (37.1 C) (Oral)   Resp 16   Ht 6' 2"  (1.88 m)   Wt 99.8 kg   SpO2 95%   BMI 28.25 kg/m   Physical Exam Vitals and nursing note reviewed.  Constitutional:      General: He is not in acute distress.    Appearance: Normal appearance. He is well-developed.  HENT:     Head: Normocephalic and atraumatic.  Eyes:     General: No scleral icterus.    Conjunctiva/sclera: Conjunctivae normal.  Pulmonary:     Effort: Pulmonary effort is normal. No respiratory distress.  Breath sounds: Normal breath sounds. No wheezing.  Abdominal:     General: Abdomen is flat. There is no distension.     Palpations: Abdomen is soft.     Tenderness: There is no abdominal tenderness.     Comments: No abdominal tenderness to deep palpation  Musculoskeletal:        General: No deformity or signs of injury.     Cervical back: Normal range of motion.  Skin:    Coloration: Skin is not jaundiced or pale.  Neurological:     General: No focal deficit present.     Mental Status: He is alert and oriented to person, place, and time. Mental status is at baseline.  Psychiatric:        Mood and Affect: Mood normal.        Behavior:  Behavior normal.     LABS (all labs ordered are listed, but only abnormal results are displayed)  Labs Reviewed  COMPREHENSIVE METABOLIC PANEL - Abnormal; Notable for the following components:      Result Value   Glucose, Bld 130 (*)    AST 239 (*)    ALT 81 (*)    All other components within normal limits  CBC - Abnormal; Notable for the following components:   WBC 14.1 (*)    All other components within normal limits  LIPASE, BLOOD  URINALYSIS, COMPLETE (UACMP) WITH MICROSCOPIC   ____________________________________________  EKG   ED ECG REPORT I, Madelin Headings, the attending physician, personally viewed and interpreted this ECG.  Date: 01/29/2021 Eal sinus rhythm QRS Axis: normal Intervals: normal ST/T Wave abnormalities: normal Narrative Interpretation: no evidence of acute ischemia  ____________________________________________  RADIOLOGY I, Madelin Headings, personally viewed and evaluated these images (plain radiographs) as part of my medical decision making, as well as reviewing the written report by the radiologist.  ED MD interpretation: I reviewed the radiology ultrasound of the right upper quadrant that was obtained which does not show any acute abnormality    ____________________________________________   PROCEDURES  Procedure(s) performed (including Critical Care):  Procedures   ____________________________________________   INITIAL IMPRESSION / ASSESSMENT AND PLAN / ED COURSE  30 year old male with alcohol use disorder who presents with epigastric pain nausea and vomiting since last night.  Labs show a transaminitis with AST to ALT in the 2:1 ratio, normal bilirubin alk phos, only elevated white count of 14.1.  Right upper quadrant ultrasound without cholecystitis or any CBD dilatation.  Well-appearing on exam, abdomen is benign.  Vital signs all within normal limits.  I suspect stratus versus mild alcoholic hepatitis.  Tylenol use and he has  no sick contacts or risk factors for hepatitis a in addition I would suspect his LFTs to be significantly more elevated and I feel that they are most likely related to his alcohol intake.  We discussed that he needs to limit his alcohol intake avoid Tylenol and follow-up with his primary care provider as he will need these liver function test repeated to ensure that they are trending down.     Clinical Course as of 01/29/21 1553  Tue Jan 29, 2021  0740 IMPRESSION: Unremarkable sonographic appearance of the gallbladder and biliary tree.     [KM]    Clinical Course User Index [KM] Rada Hay, MD     ____________________________________________   FINAL CLINICAL IMPRESSION(S) / ED DIAGNOSES  Final diagnoses:  Right upper quadrant pain     ED Discharge Orders     None  Note:  This document was prepared using Dragon voice recognition software and may include unintentional dictation errors.    Rada Hay, MD 01/29/21 [phone]

## 2021-01-29 NOTE — ED Notes (Signed)
Pt tolerating PO fluids and foods.

## 2021-01-29 NOTE — Discharge Instructions (Addendum)
With your primary care provider regarding your elevated liver function test.  Please decrease your alcohol intake.  Please avoid Tylenol.  If you are unable to eat or drink, return to the emergency department.

## 2021-01-29 NOTE — ED Notes (Signed)
Caetano Oberhaus pts wife [phone]

## 2021-07-09 ENCOUNTER — Emergency Department: Payer: BC Managed Care – PPO

## 2021-07-09 ENCOUNTER — Other Ambulatory Visit: Payer: Self-pay

## 2021-07-09 ENCOUNTER — Emergency Department
Admission: EM | Admit: 2021-07-09 | Discharge: 2021-07-09 | Disposition: A | Discharge status: Home / Self Care | Payer: BC Managed Care – PPO | Attending: Emergency Medicine | Admitting: Emergency Medicine

## 2021-07-09 DIAGNOSIS — J3489 Other specified disorders of nose and nasal sinuses: Secondary | ICD-10-CM | POA: Insufficient documentation

## 2021-07-09 DIAGNOSIS — R519 Headache, unspecified: Secondary | ICD-10-CM | POA: Insufficient documentation

## 2021-07-09 LAB — COMPREHENSIVE METABOLIC PANEL
ALT: 16 U/L (ref 0–44)
AST: 25 U/L (ref 15–41)
Albumin: 4.6 g/dL (ref 3.5–5.0)
Alkaline Phosphatase: 56 U/L (ref 38–126)
Anion gap: 11 (ref 5–15)
BUN: 11 mg/dL (ref 6–20)
CO2: 25 mmol/L (ref 22–32)
Calcium: 9.4 mg/dL (ref 8.9–10.3)
Chloride: 100 mmol/L (ref 98–111)
Creatinine, Ser: 0.87 mg/dL (ref 0.61–1.24)
GFR, Estimated: >60 mL/min (ref >60)
Glucose, Bld: 86 mg/dL (ref 70–99)
Potassium: 3.9 mmol/L (ref 3.5–5.1)
Sodium: 136 mmol/L (ref 135–145)
Total Bilirubin: 0.9 mg/dL (ref 0.3–1.2)
Total Protein: 7.9 g/dL (ref 6.5–8.1)

## 2021-07-09 LAB — CBC WITH DIFFERENTIAL/PLATELET
Abs Immature Granulocytes: 0.05 10*3/uL (ref 0.00–0.07)
Basophils Absolute: 0.1 10*3/uL (ref 0.0–0.1)
Basophils Relative: 1 %
Eosinophils Absolute: 0.2 10*3/uL (ref 0.0–0.5)
Eosinophils Relative: 2 %
HCT: 45 % (ref 39.0–52.0)
Hemoglobin: 15.3 g/dL (ref 13.0–17.0)
Immature Granulocytes: 1 %
Lymphocytes Relative: 32 %
Lymphs Abs: 2.9 10*3/uL (ref 0.7–4.0)
MCH: 32.8 pg (ref 26.0–34.0)
MCHC: 34 g/dL (ref 30.0–36.0)
MCV: 96.4 fL (ref 80.0–100.0)
Monocytes Absolute: 0.8 10*3/uL (ref 0.1–1.0)
Monocytes Relative: 9 %
Neutro Abs: 5.2 10*3/uL (ref 1.7–7.7)
Neutrophils Relative %: 55 %
Platelets: 260 10*3/uL (ref 150–400)
RBC: 4.67 MIL/uL (ref 4.22–5.81)
RDW: 13.9 % (ref 11.5–15.5)
WBC: 9.2 10*3/uL (ref 4.0–10.5)
nRBC: 0 % (ref 0.0–0.2)

## 2021-07-09 MED ORDER — AMOXICILLIN-POT CLAVULANATE 875-125 MG PO TABS: 1.0000 | ORAL_TABLET | Freq: Two times a day (BID) | ORAL | 0 refills | Status: AC

## 2021-07-09 MED ORDER — GADOBUTROL 1 MMOL/ML IV SOLN
10.0000 mL | Freq: Once | INTRAVENOUS | Status: AC | PRN
  Administered 2021-07-09: 10 mL via INTRAVENOUS
  Filled 2021-07-09: qty 10

## 2021-07-09 MED ORDER — LORAZEPAM 2 MG/ML IJ SOLN
1.0000 mg | Freq: Once | INTRAMUSCULAR | Status: AC
  Administered 2021-07-09: 1 mg via INTRAVENOUS
  Filled 2021-07-09: qty 1

## 2021-07-09 NOTE — ED Notes (Signed)
Pt transported back from CT via [facility] with CT tech.

## 2021-07-09 NOTE — ED Notes (Signed)
Patient transported to CT via WC with CT tech. 

## 2021-07-09 NOTE — Discharge Instructions (Addendum)
Take Augmentin twice daily for ten days.  

## 2021-07-09 NOTE — ED Notes (Signed)
Pt transported to MRI via WC with MRI tech. °

## 2021-07-09 NOTE — ED Notes (Signed)
Pt returned from MRI via WC with MRI tech.

## 2021-07-09 NOTE — ED Provider Notes (Signed)
[facility] Provider Note  Patient Contact: 3:38 PM (approximate)   History   Sinus Problem   HPI  Damon Romero is a 31 y.o. male presents to the emergency department with purulent rhinorrhea and atypical headache.  Patient states he does have a history of sinusitis but his current symptoms do not feel similar.  He describes his headache as bilateral temporal in nature and nonradiating without blurry vision or photophobia.  Denies fever and chills at home.  Patient was evaluated at urgent care and was referred to the emergency department for possible CSF leak.  Patient denies other falls or mechanisms of trauma.  Denies nausea or vomiting.      Physical Exam   Triage Vital Signs: ED Triage Vitals  Enc Vitals Group     BP 07/09/21 1441 (!) 156/89     Pulse Rate 07/09/21 1441 75     Resp 07/09/21 1441 18     Temp 07/09/21 1441 97.7 F (36.5 C)     Temp Source 07/09/21 1441 Oral     SpO2 07/09/21 1441 100 %     Weight 07/09/21 1447 225 lb (102.1 kg)     Height 07/09/21 1447 6\' 3"  (1.905 m)     Head Circumference --      Peak Flow --      Pain Score 07/09/21 1447 6     Pain Loc --      Pain Edu? --      Excl. in [place]? --     Most recent vital signs: Vitals:   07/09/21 1441 07/09/21 1617  BP: (!) 156/89 140/90  Pulse: 75 70  Resp: 18 20  Temp: 97.7 F (36.5 C)   SpO2: 100% 95%     General: Alert and in no acute distress. Eyes:  PERRL. EOMI. Head: No acute traumatic findings ENT:      Ears: Tms are pearly.       Nose: No congestion/rhinnorhea.      Mouth/Throat: Mucous membranes are moist.  Neck: No stridor. No cervical spine tenderness to palpation. Cardiovascular:  Good peripheral perfusion Respiratory: Normal respiratory effort without tachypnea or retractions. Lungs CTAB. Good air entry to the bases with no decreased or absent breath sounds. Gastrointestinal: Bowel sounds 4 quadrants. Soft and nontender to palpation. No  guarding or rigidity. No palpable masses. No distention. No CVA tenderness. Musculoskeletal: Full range of motion to all extremities.  Neurologic:  No gross focal neurologic deficits are appreciated.  Skin:   No rash noted Other:   ED Results / Procedures / Treatments   Labs (all labs ordered are listed, but only abnormal results are displayed) Labs Reviewed  CBC WITH DIFFERENTIAL/PLATELET  COMPREHENSIVE METABOLIC PANEL  URINALYSIS, COMPLETE (UACMP) WITH MICROSCOPIC        RADIOLOGY  I personally viewed and evaluated these images as part of my medical decision making, as well as reviewing the written report by the radiologist.  ED Provider Interpretation:    PROCEDURES:  Critical Care performed: No  Procedures   MEDICATIONS ORDERED IN ED: Medications  LORazepam (ATIVAN) injection 1 mg (1 mg Intravenous Given 07/09/21 1718)  gadobutrol (GADAVIST) 1 MMOL/ML injection 10 mL (10 mLs Intravenous Contrast Given 07/09/21 1743)     IMPRESSION / MDM / [place] / ED COURSE  I reviewed the triage vital signs and the nursing notes.  Differential diagnosis includes, but is not limited to, sinusitis, viral URI, CSF leak...  Assessment and plan:  Rhinorrhea:  32 year old male presents to the emergency department with concern for possible CSF leak.  Patient states that over the past 3 days, he has had atypical headache and has had up to three daily  episodes of copious clear, yellow rhinorrhea that presents "almost like a nosebleed" according to patient.  He states that episodes last for several seconds to a minute and then seem to resolve on their own.  Vital signs were reassuring at triage.  On physical exam, patient was alert, active and nontoxic-appearing.  He had no neurodeficits on exam.  My attending and I reviewed pictures that patient has taken at home and agree that appearance of nasal secretions seems atypical.  A specimen cup has  been given to the patient with instructions to collect secretions if possible.  I consulted radiologist, Dr. Logan Bores regarding patient case who recommended MRI brain with and without contrast and dedicated CT max face.   MRI brain and CT max face within the parameters of normal.  We will treat for sinusitis with Augmentin twice daily for the next 10 days.  Patient was unable to produce sample of nasal secretions.  Return precautions were given to return with new or worsening symptoms.  FINAL CLINICAL IMPRESSION(S) / ED DIAGNOSES   Final diagnoses:  Rhinorrhea     Rx / DC Orders   ED Discharge Orders          Ordered    amoxicillin-clavulanate (AUGMENTIN) 875-125 MG tablet  2 times daily        07/09/21 1826             Note:  This document was prepared using Dragon voice recognition software and may include unintentional dictation errors.   Vallarie Mare [place], PA-C 07/09/21 1830    Nance Pear, MD 07/09/21 1945

## 2021-07-09 NOTE — ED Notes (Signed)
Light green & lavender tubes sent to lab. 

## 2021-07-09 NOTE — ED Triage Notes (Addendum)
Pt here with nasal drainage. Pt states that yellow fluid comes out of his nose when he bends over which is not normal for him. Pt went to UC who told pt to come to ED for eval of spinal fluid drainage. Pt also coughs and has a severe headache. Pt otherwise normal and in NAD in triage.

## 2022-03-11 ENCOUNTER — Emergency Department
Admission: EM | Admit: 2022-03-11 | Discharge: 2022-03-11 | Disposition: A | Discharge status: Home / Self Care | Payer: BC Managed Care – PPO | Attending: Emergency Medicine | Admitting: Emergency Medicine

## 2022-03-11 DIAGNOSIS — R112 Nausea with vomiting, unspecified: Secondary | ICD-10-CM | POA: Diagnosis present

## 2022-03-11 DIAGNOSIS — R197 Diarrhea, unspecified: Secondary | ICD-10-CM | POA: Diagnosis not present

## 2022-03-11 LAB — CBC
HCT: 44.6 % (ref 39.0–52.0)
Hemoglobin: 15 g/dL (ref 13.0–17.0)
MCH: 31.6 pg (ref 26.0–34.0)
MCHC: 33.6 g/dL (ref 30.0–36.0)
MCV: 94.1 fL (ref 80.0–100.0)
Platelets: 222 10*3/uL (ref 150–400)
RBC: 4.74 MIL/uL (ref 4.22–5.81)
RDW: 13.1 % (ref 11.5–15.5)
WBC: 11.2 10*3/uL — ABNORMAL HIGH (ref 4.0–10.5)
nRBC: 0 % (ref 0.0–0.2)

## 2022-03-11 LAB — COMPREHENSIVE METABOLIC PANEL
ALT: 17 U/L (ref 0–44)
AST: 23 U/L (ref 15–41)
Albumin: 4.6 g/dL (ref 3.5–5.0)
Alkaline Phosphatase: 53 U/L (ref 38–126)
Anion gap: 10 (ref 5–15)
BUN: 13 mg/dL (ref 6–20)
CO2: 25 mmol/L (ref 22–32)
Calcium: 9.6 mg/dL (ref 8.9–10.3)
Chloride: 106 mmol/L (ref 98–111)
Creatinine, Ser: 1.11 mg/dL (ref 0.61–1.24)
GFR, Estimated: >60 mL/min (ref >60)
Glucose, Bld: 94 mg/dL (ref 70–99)
Potassium: 4 mmol/L (ref 3.5–5.1)
Sodium: 141 mmol/L (ref 135–145)
Total Bilirubin: 0.7 mg/dL (ref 0.3–1.2)
Total Protein: 7.8 g/dL (ref 6.5–8.1)

## 2022-03-11 LAB — URINALYSIS, ROUTINE W REFLEX MICROSCOPIC
Bilirubin Urine: NEGATIVE
Glucose, UA: NEGATIVE mg/dL
Hgb urine dipstick: NEGATIVE
Ketones, ur: NEGATIVE mg/dL
Leukocytes,Ua: NEGATIVE
Nitrite: NEGATIVE
Protein, ur: 30 mg/dL — AB
Specific Gravity, Urine: 1.025 (ref 1.005–1.030)
pH: 5 (ref 5.0–8.0)

## 2022-03-11 LAB — LIPASE, BLOOD: Lipase: 35 U/L (ref 11–51)

## 2022-03-11 MED ORDER — SODIUM CHLORIDE 0.9 % IV BOLUS
1000.0000 mL | Freq: Once | INTRAVENOUS | Status: AC
  Administered 2022-03-11: 1000 mL via INTRAVENOUS

## 2022-03-11 MED ORDER — ONDANSETRON 4 MG PO TBDP: 4.0000 mg | ORAL_TABLET | Freq: Three times a day (TID) | ORAL | 0 refills | Status: AC | PRN

## 2022-03-11 MED ORDER — ONDANSETRON HCL 4 MG/2ML IJ SOLN
4.0000 mg | Freq: Once | INTRAMUSCULAR | Status: AC
  Administered 2022-03-11: 4 mg via INTRAVENOUS
  Filled 2022-03-11: qty 2

## 2022-03-11 NOTE — ED Triage Notes (Signed)
Pt presents to ED from home C/O n/v/d X 3 days. States he can't keep anything down.

## 2022-03-11 NOTE — ED Notes (Signed)
E-signature pad unavailable - Pt verbalized understanding of D/C information - no additional concerns at this time.  

## 2022-03-11 NOTE — Discharge Instructions (Addendum)
Plenty of fluids to stay well-hydrated-find Pedialyte or other similar electrolyte replacement formulas at your local pharmacy.  If you vomit bright red blood, have a bowel movement that looks sticky and pitch-black, or have symptoms of blood loss like lightheadedness, fainting, shortness of breath then come back to the emergency department right away.   Take zofran for nausea and vomiting as needed.  Thank you for choosing [place] for your health care today!  Please see your primary doctor this week for a follow up appointment.   If you do not have a primary doctor call the following clinics to establish care:  If you have insurance:  [facility] [phone] [address] [place] [place] [address]   [facility]  [phone] [address] [place] [place] [address]   If you do not have insurance:  [facility]  [phone] [address], [place] [address]  Sometimes, in the early stages of certain disease courses it is difficult to detect in the emergency department evaluation -- so, it is important that you continue to monitor your symptoms and call your doctor right away or return to the emergency department if you develop any new or worsening symptoms.  It was my pleasure to care for you today.   Daneil Dan Modesto Charon, MD

## 2022-03-11 NOTE — ED Provider Notes (Addendum)
[facility] Provider Note    Event Date/Time   First MD Initiated Contact with Patient 03/11/22 1619     (approximate)   History   Emesis and Diarrhea   HPI  Damon Romero is a 31 y.o. male   Past medical history of with a history of anxiety, GERD who presents with several days of nausea vomiting diarrhea.  Some blood on the toilet paper after being, known hemorrhoids.  No blood in the emesis.  No known sick contacts, no recent travel outside of the [place], no exposures to natural waters, no recent hospitalizations or antibiotics  Denies abdominal pain.  No dysuria or testicular pain.  History was obtained via the patient and a review of external medical notes      Physical Exam   Triage Vital Signs: ED Triage Vitals  Enc Vitals Group     BP 03/11/22 1535 (!) 151/86     Pulse Rate 03/11/22 1535 (!) 107     Resp 03/11/22 1535 16     Temp 03/11/22 1535 98.3 F (36.8 C)     Temp Source 03/11/22 1535 Oral     SpO2 03/11/22 1535 97 %     Weight 03/11/22 1536 250 lb (113.4 kg)     Height 03/11/22 1536 6\' 3"  (1.905 m)     Head Circumference --      Peak Flow --      Pain Score 03/11/22 1536 0     Pain Loc --      Pain Edu? --      Excl. in GC? --     Most recent vital signs: Vitals:   03/11/22 1737 03/11/22 1830  BP: 130/79 131/84  Pulse: 65 60  Resp:  16  Temp:    SpO2: 98% 99%    General: Awake, no distress.  CV:  Good peripheral perfusion.  Overall euvolemic Resp:  Normal effort.  Abd:  No distention.  Nontender to palpation in all quadrants    ED Results / Procedures / Treatments   Labs (all labs ordered are listed, but only abnormal results are displayed) Labs Reviewed  CBC - Abnormal; Notable for the following components:      Result Value   WBC 11.2 (*)    All other components within normal limits  URINALYSIS, ROUTINE W REFLEX MICROSCOPIC - Abnormal; Notable for the following components:   Color, Urine  YELLOW (*)    APPearance HAZY (*)    Protein, ur 30 (*)    Bacteria, UA RARE (*)    All other components within normal limits  LIPASE, BLOOD  COMPREHENSIVE METABOLIC PANEL     I reviewed labs and they are notable for mild leukocytosis with a white blood cell count of 11.2    PROCEDURES:  Critical Care performed: No  Procedures   MEDICATIONS ORDERED IN ED: Medications  sodium chloride 0.9 % bolus 1,000 mL (0 mLs Intravenous Stopped 03/11/22 1819)  ondansetron (ZOFRAN) injection 4 mg (4 mg Intravenous Given 03/11/22 1651)     IMPRESSION / MDM / ASSESSMENT AND PLAN / ED COURSE  I reviewed the triage vital signs and the nursing notes.                              Differential diagnosis includes, but is not limited to, viral gastroenteritis, less likely intra-abdominal infection or obstruction given a very benign abdominal exam.  I  considered admission for monitoring and serial abdominal exams, however given overall well appearance, benign abdominal exam this time, and outpatient plan with anticipatory guidance for viral gastroenteritis in this otherwise healthy young man seems appropriate, pt in agreement. Plan for PMD follow-up, return precautions to the emergency department if worsening.  7:34 PM At the time of discharge, patient had another episode of small emesis with some blood-tinged opponent.  It was more than he had seen in his previous episodes of emesis, but had seen some blood initially in his earlier episodes as well.  H&H is normal and hemodynamics remain stable here.  He is not an active drinker he was a previous drinker, and patient reports no known history of varices.  I considered observation admission for ongoing monitoring of hemodynamics, recheck H&H, monitor symptoms and had a shared decision making with the patient who instead elected to self monitor at home, and return to the emergency department with any worsening, hematemesis or melena, symptoms of blood  loss.  Patient's presentation is most consistent with acute illness / injury with system symptoms.       FINAL CLINICAL IMPRESSION(S) / ED DIAGNOSES   Final diagnoses:  Nausea vomiting and diarrhea     Rx / DC Orders   ED Discharge Orders          Ordered    ondansetron (ZOFRAN-ODT) 4 MG disintegrating tablet  Every 8 hours PRN        03/11/22 1713             Note:  This document was prepared using Dragon voice recognition software and may include unintentional dictation errors.    Pilar Jarvis, MD 03/11/22 1719    Pilar Jarvis, MD 03/11/22 [phone]

## 2022-07-09 IMAGING — CT CT HEAD W/O CM
4 series · 16 of 47 positions shown, 18 images · non-contrast
Comparison: CT brain report 09/17/1998

CLINICAL DATA: Severe headache



[Series 2: head bone · axial · 0.47mm/px · z∈[-87,-55]mm · 3 of 79 slices shown]
[im 8/79  bone]
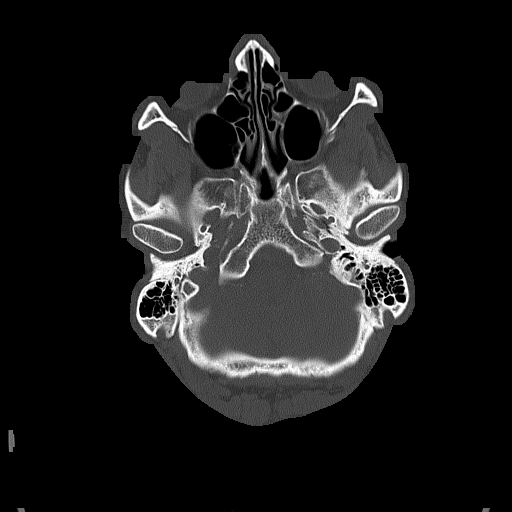
[im 16/79  bone]
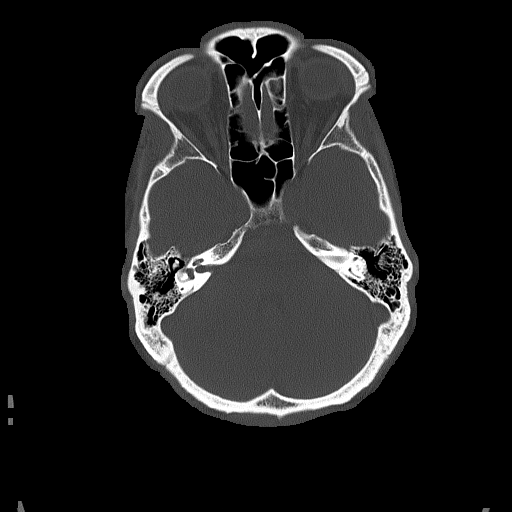
[im 24/79  bone]
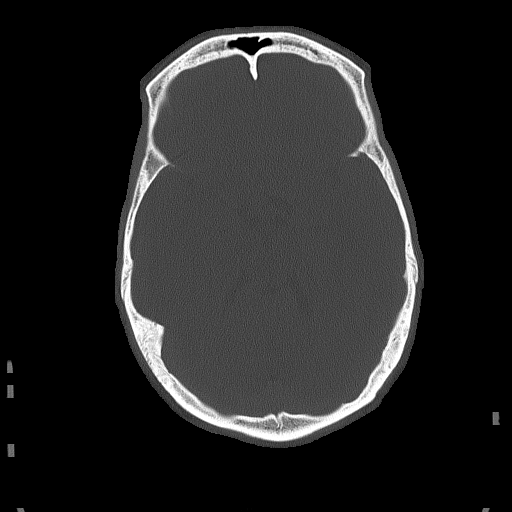

[Series 3: head wo · axial · 0.47mm/px · z∈[-86,+34]mm · 7 of 32 slices shown, 9 images]
[im 4/32  brain]
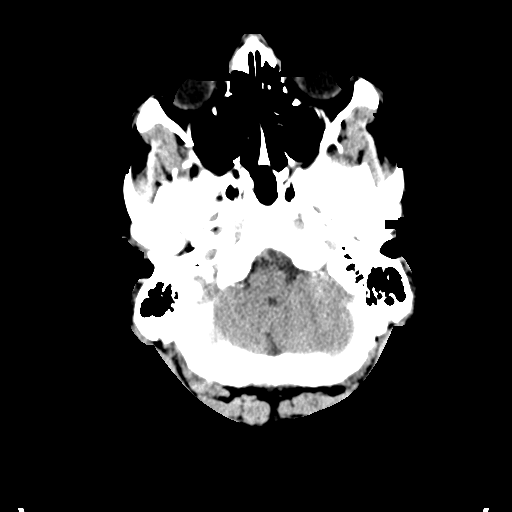
[im 4/32  bone]
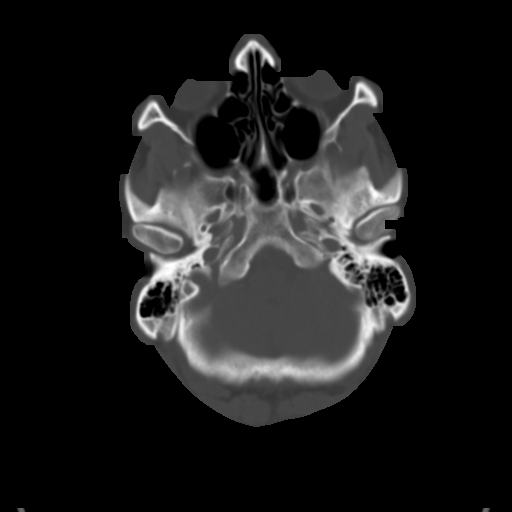
[im 8/32  brain]
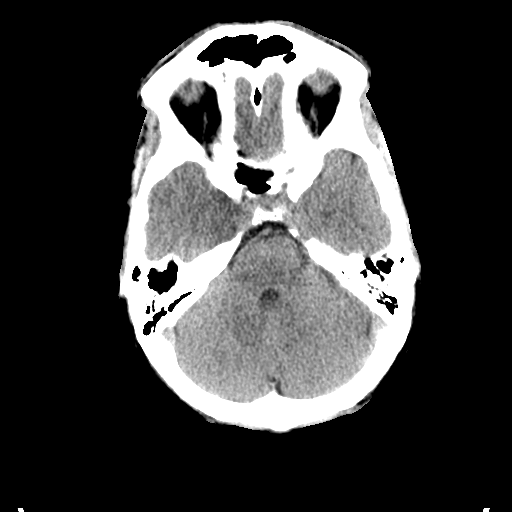
[im 12/32  brain]
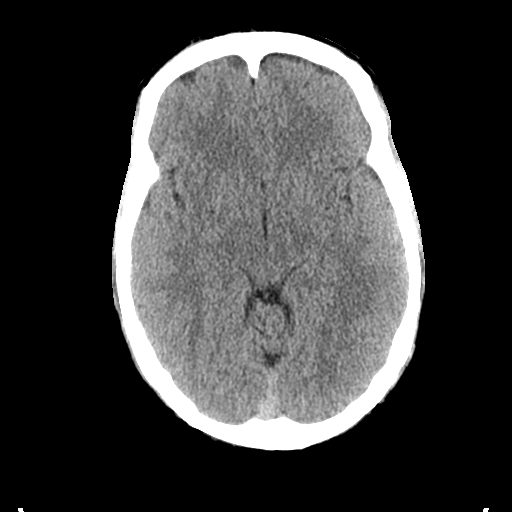
[im 16/32  brain]
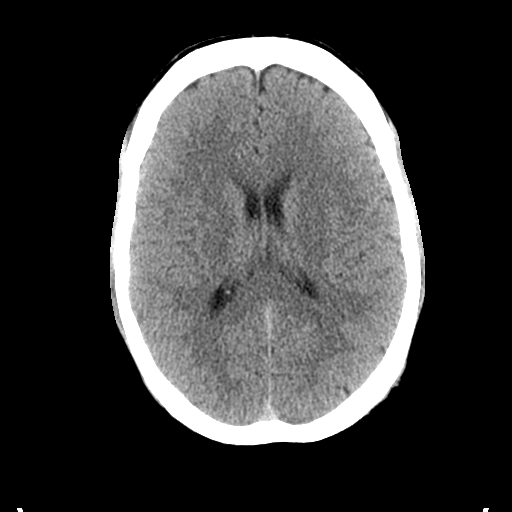
[im 20/32  brain]
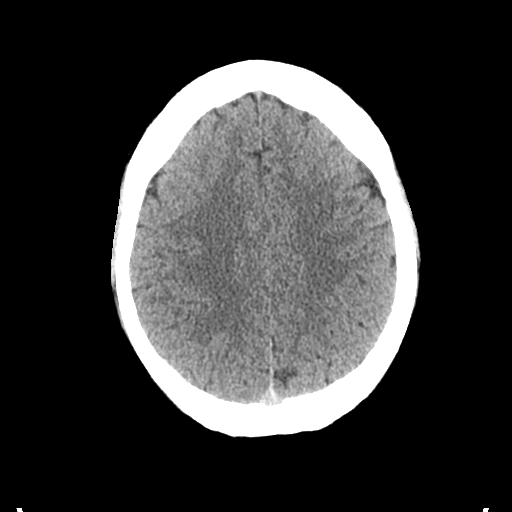
[im 20/32  bone]
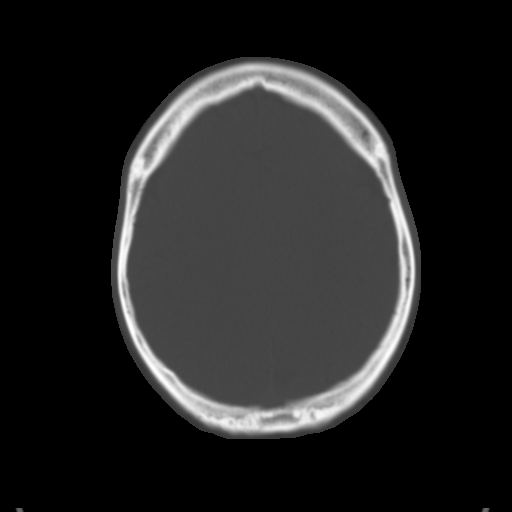
[im 24/32  brain]
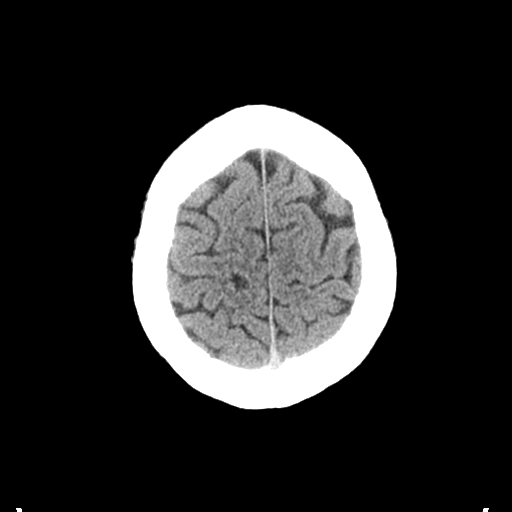
[im 28/32  brain]
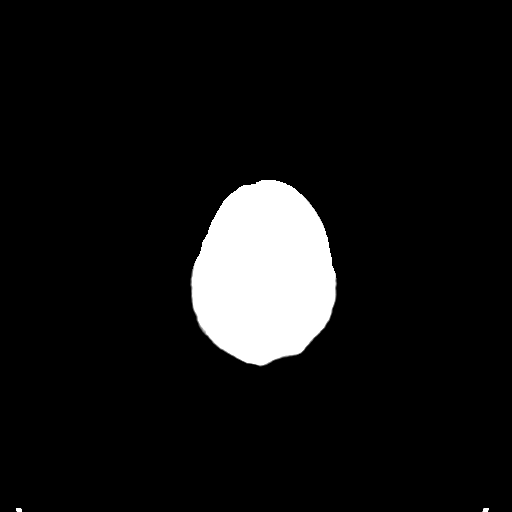

[Series 4: coronal soft tissue · coronal · 0.30mm/px · 3 of 69 slices shown]
[im 23/69  brain]
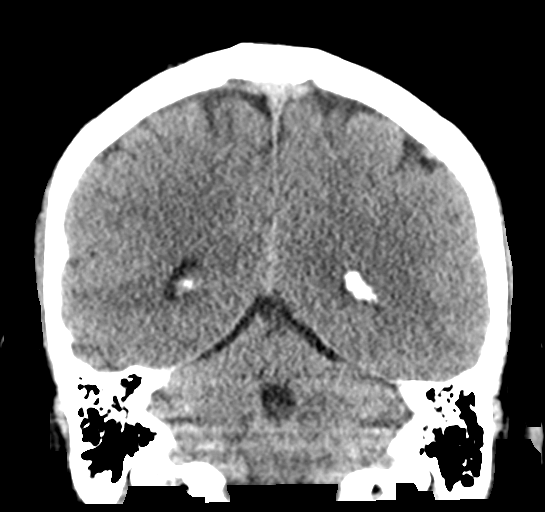
[im 31/69  brain]
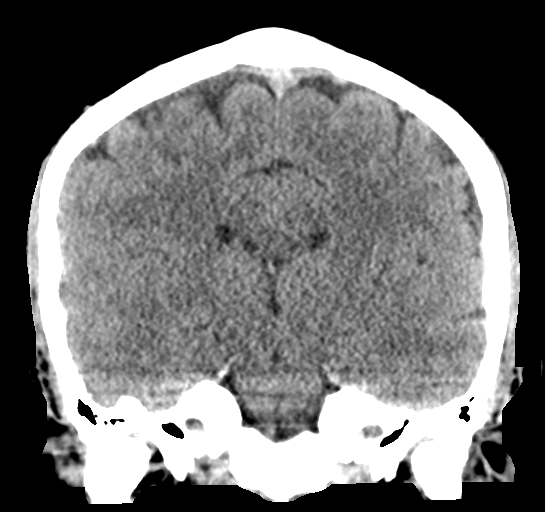
[im 38/69  brain]
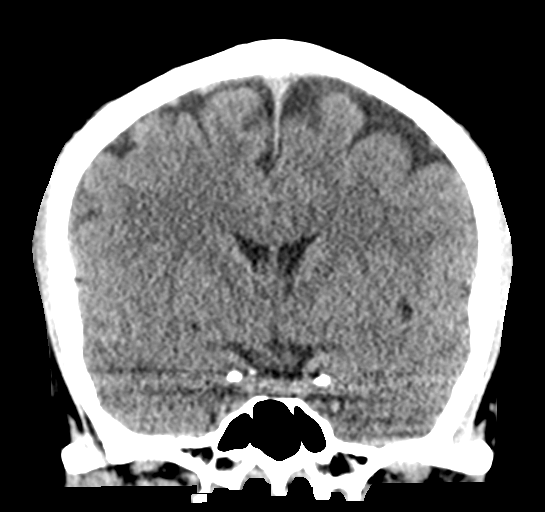

[Series 5: sagittal soft tissue · sagittal · 0.32mm/px · 3 of 56 slices shown]
[im 19/56  brain]
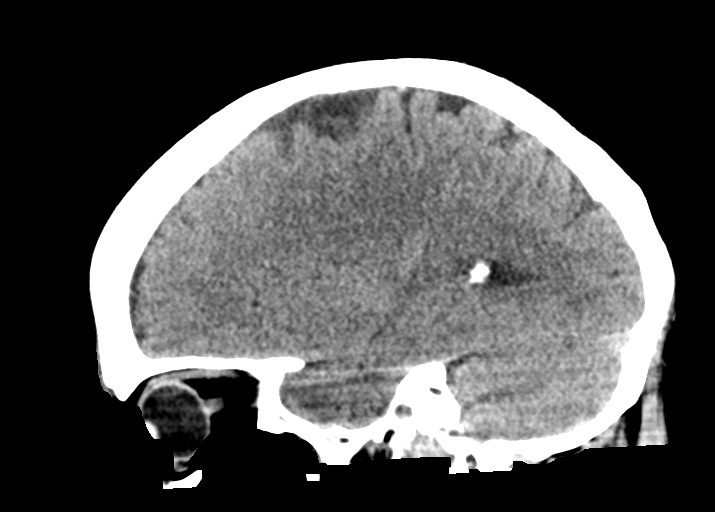
[im 28/56  brain]
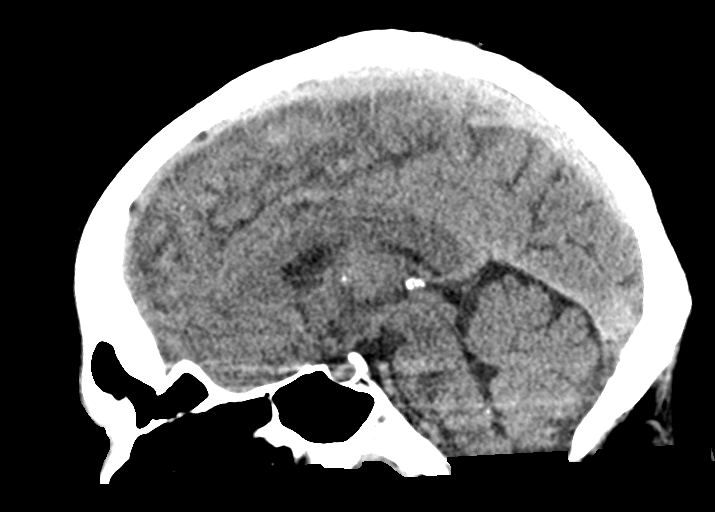
[im 37/56  brain]
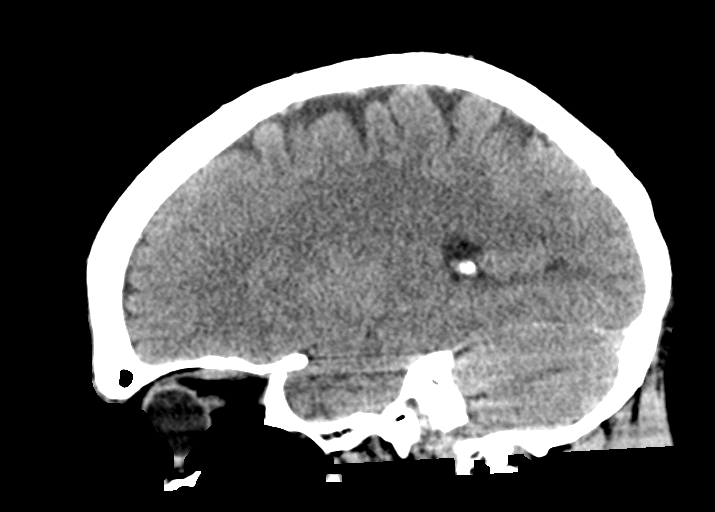

[16 of 47 positions shown; findings below may reference images not displayed]

FINDINGS: Brain: No evidence of acute infarction, hemorrhage, hydrocephalus,
extra-axial collection or mass lesion/mass effect.

Vascular: No hyperdense vessel or unexpected calcification.

Skull: Normal. Negative for fracture or focal lesion.

Sinuses/Orbits: Small fluid level in the left maxillary sinus.

Other: None
IMPRESSION: 1. No CT evidence for acute intracranial abnormality.
2. Small fluid level in the left maxillary sinus consistent with
sinusitis

## 2022-07-09 IMAGING — CT CT MAXILLOFACIAL W/O CM
3 of 7 series · 16 of 47 positions shown, 19 images · non-contrast
Comparison: CT head 07/09/2021

CLINICAL DATA: Head trauma, CSF leak suspected



[Series 2: max soft · axial · 0.37mm/px · z∈[-230,-74]mm · 12 of 86 slices shown, 15 images]
[im 4/86  brain]
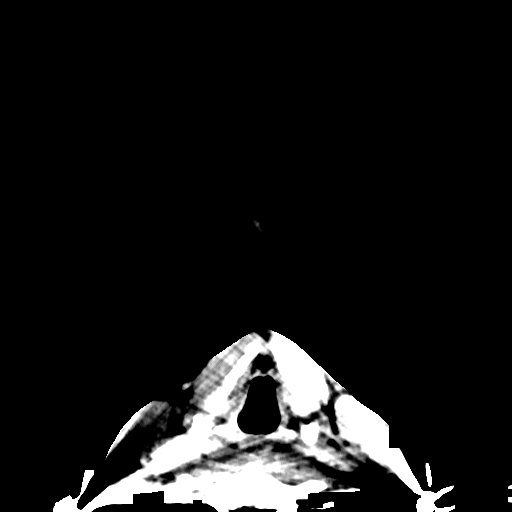
[im 4/86  bone]
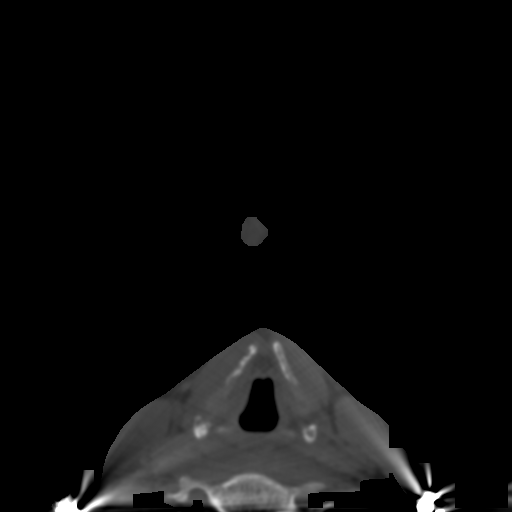
[im 12/86  bone]
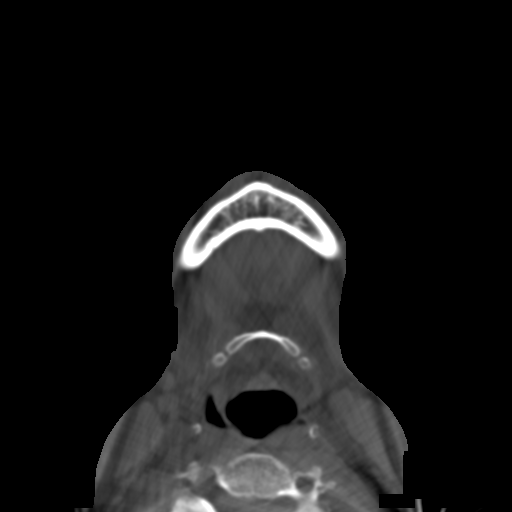
[im 20/86  bone]
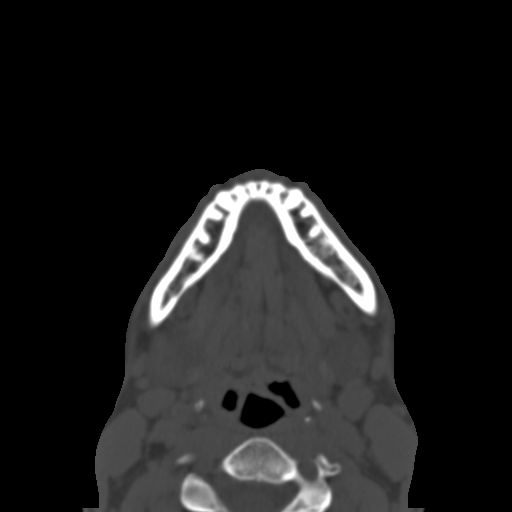
[im 28/86  bone]
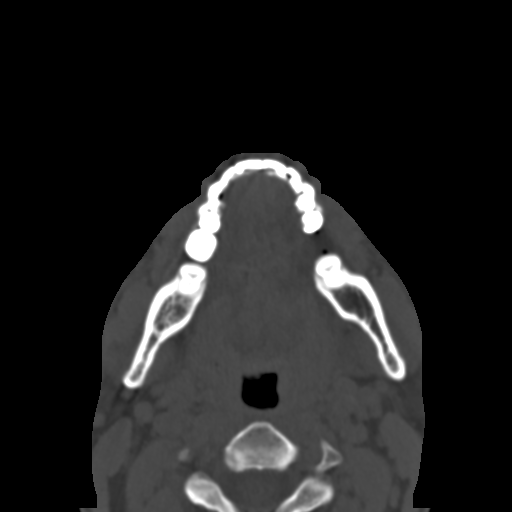
[im 31/86  brain]
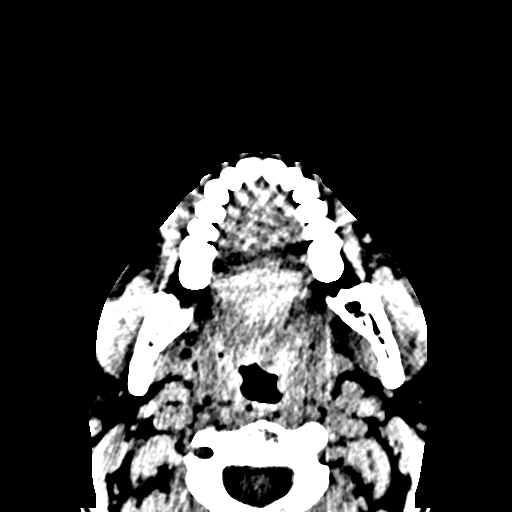
[im 31/86  bone]
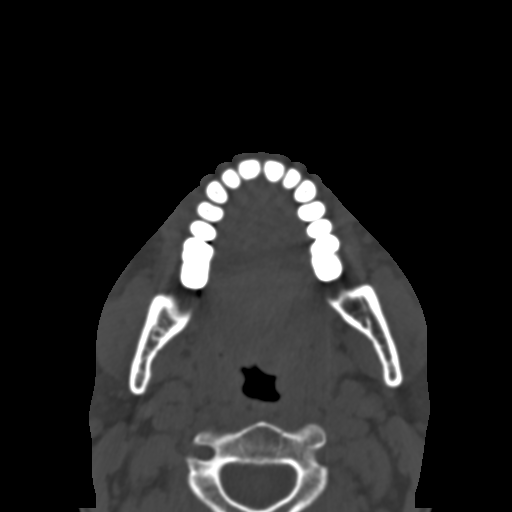
[im 39/86  bone]
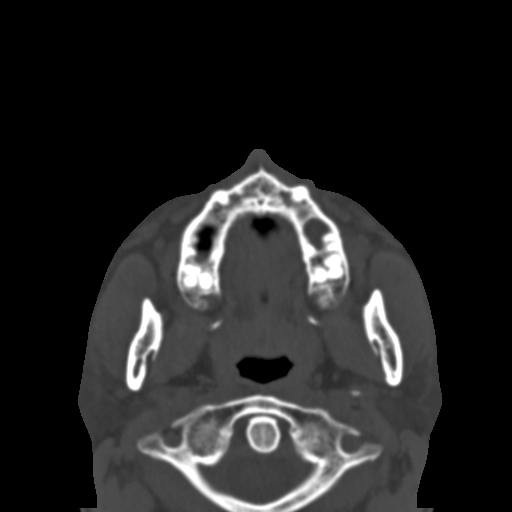
[im 47/86  bone]
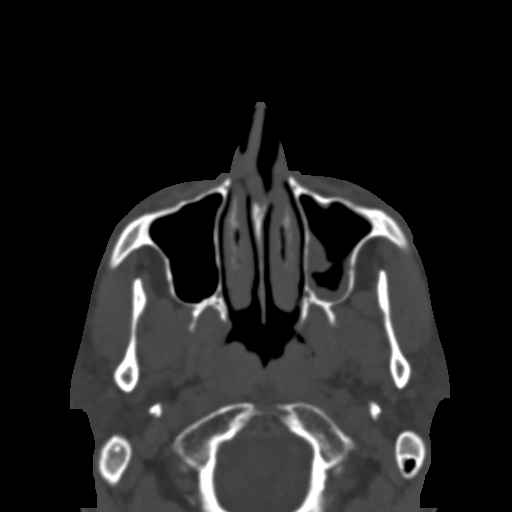
[im 55/86  bone]
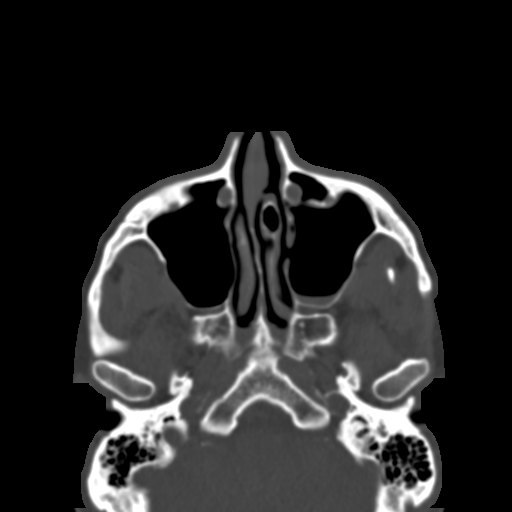
[im 58/86  brain]
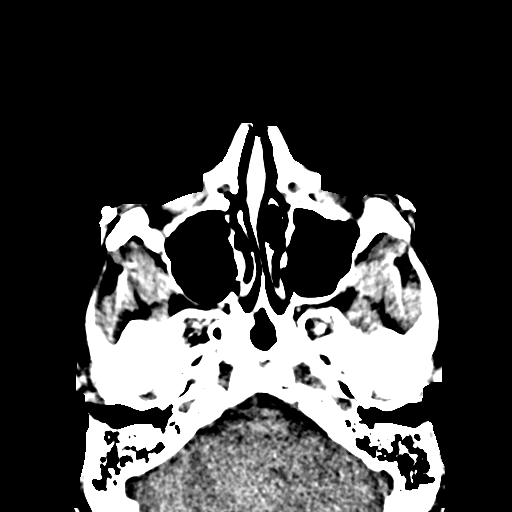
[im 58/86  bone]
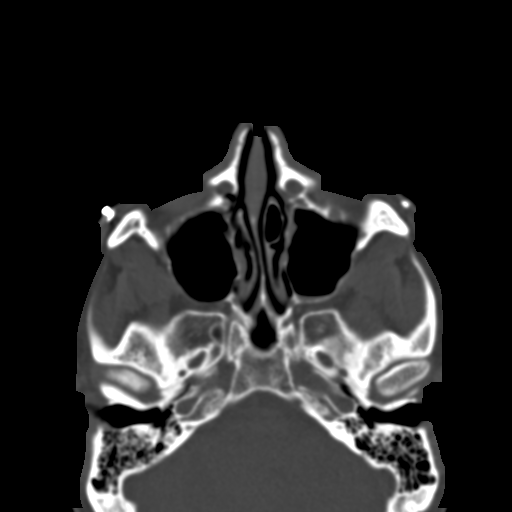
[im 66/86  bone]
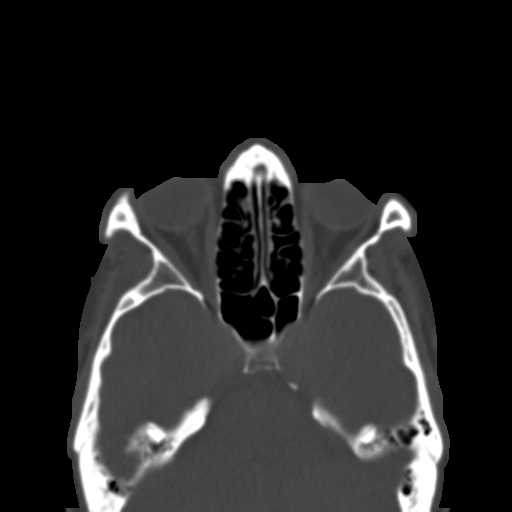
[im 74/86  bone]
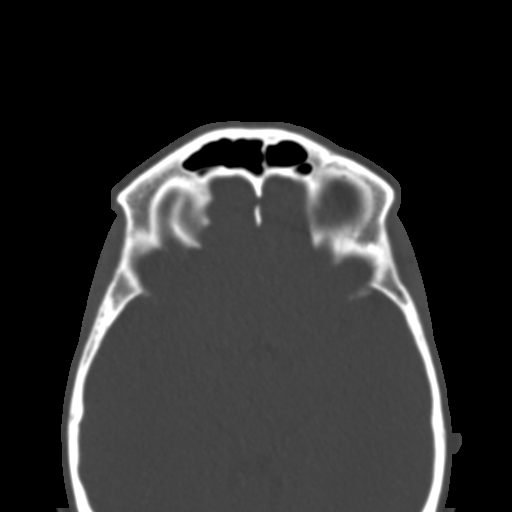
[im 82/86  bone]
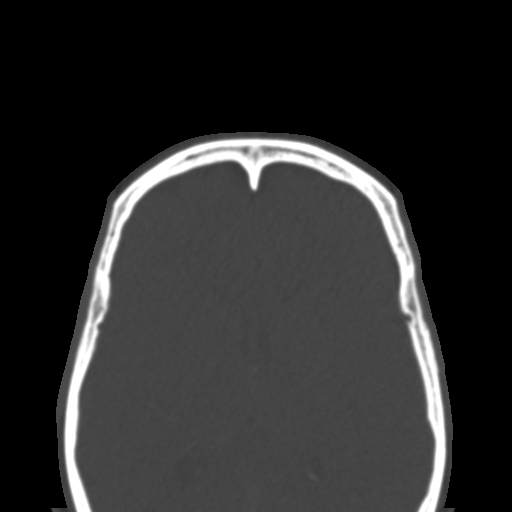

[Series 6: coronal soft (person_name) · coronal · 0.34mm/px · 3 of 90 slices shown]
[im 23/90  bone]
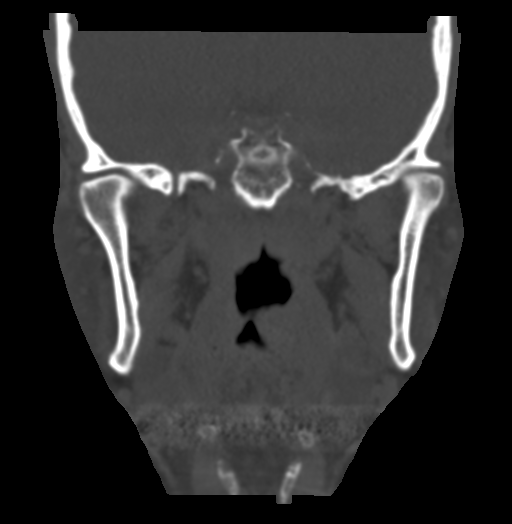
[im 45/90  bone]
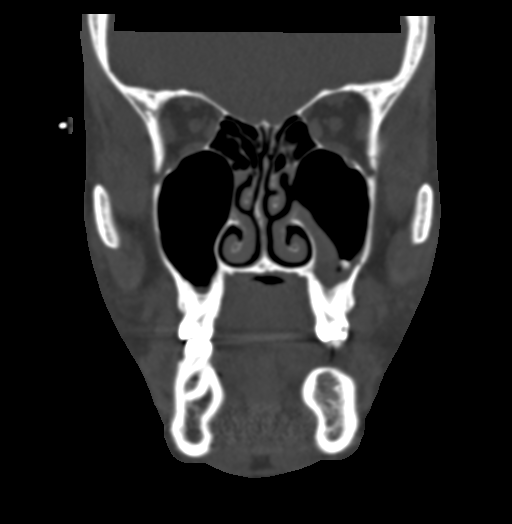
[im 67/90  bone]
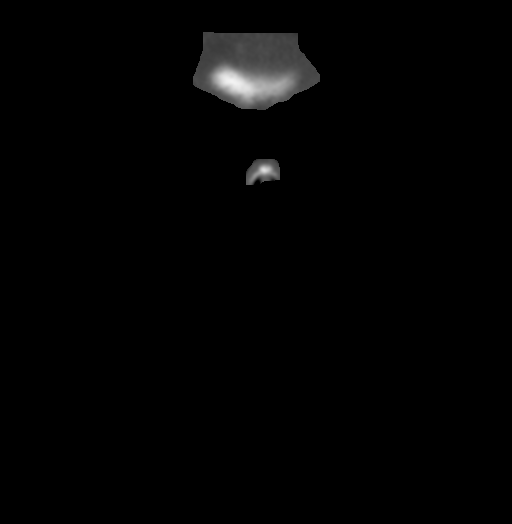

[Series 8: sagittal soft (person_name) · sagittal · 0.35mm/px · 1 of 89 slices shown]
[im 45/89  bone]
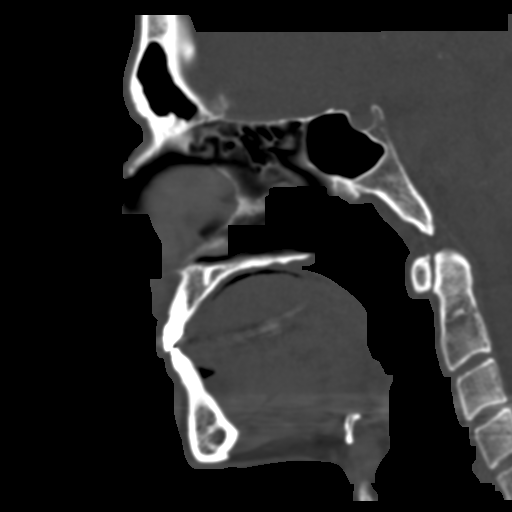

[16 of 47 positions shown; findings below may reference images not displayed]

FINDINGS: Osseous: No fracture or mandibular dislocation. No destructive
process.

Orbits: Negative. No traumatic or inflammatory finding.

Sinuses: Mild mucosal thickening of bilateral maxillary sinuses.

Soft tissues: Negative.

Limited intracranial: No significant or unexpected finding.
IMPRESSION: No acute displaced fracture.
